# Patient Record
Sex: Male | Born: 1965
Health system: Southern US, Community
[De-identification: ages and names within clinical notes are randomized; demographics above are authoritative.]

## PROBLEM LIST (undated history)

## (undated) DIAGNOSIS — R519 Headache, unspecified: Secondary | ICD-10-CM

## (undated) DIAGNOSIS — N419 Inflammatory disease of prostate, unspecified: Secondary | ICD-10-CM

## (undated) DIAGNOSIS — K219 Gastro-esophageal reflux disease without esophagitis: Secondary | ICD-10-CM

## (undated) DIAGNOSIS — I10 Essential (primary) hypertension: Secondary | ICD-10-CM

## (undated) DIAGNOSIS — Q625 Duplication of ureter: Secondary | ICD-10-CM

## (undated) HISTORY — PX: HAND SURGERY: SHX662

## (undated) HISTORY — PX: BACK SURGERY: SHX140

---

## 2003-03-28 ENCOUNTER — Emergency Department (HOSPITAL_COMMUNITY): Admission: EM | Admit: 2003-03-28 | Discharge: 2003-03-28 | Payer: Self-pay | Admitting: Emergency Medicine

## 2003-09-13 ENCOUNTER — Encounter: Admission: RE | Admit: 2003-09-13 | Discharge: 2003-09-13 | Payer: Self-pay | Admitting: Family Medicine

## 2005-03-20 ENCOUNTER — Encounter: Admission: RE | Admit: 2005-03-20 | Discharge: 2005-03-20 | Payer: Self-pay | Admitting: Family Medicine

## 2005-03-24 ENCOUNTER — Encounter: Admission: RE | Admit: 2005-03-24 | Discharge: 2005-03-24 | Payer: Self-pay | Admitting: Orthopaedic Surgery

## 2005-03-30 ENCOUNTER — Encounter: Admission: RE | Admit: 2005-03-30 | Discharge: 2005-03-30 | Payer: Self-pay | Admitting: Family Medicine

## 2007-01-06 ENCOUNTER — Emergency Department (HOSPITAL_COMMUNITY): Admission: EM | Admit: 2007-01-06 | Discharge: 2007-01-06 | Payer: Self-pay | Admitting: Emergency Medicine

## 2008-07-07 ENCOUNTER — Emergency Department (HOSPITAL_COMMUNITY): Admission: EM | Admit: 2008-07-07 | Discharge: 2008-07-07 | Payer: Self-pay | Admitting: Emergency Medicine

## 2009-06-07 ENCOUNTER — Emergency Department (HOSPITAL_COMMUNITY): Admission: EM | Admit: 2009-06-07 | Discharge: 2009-06-07 | Payer: Self-pay | Admitting: Emergency Medicine

## 2009-08-02 HISTORY — PX: BACK SURGERY: SHX140

## 2011-03-14 ENCOUNTER — Emergency Department (HOSPITAL_COMMUNITY): Payer: BC Managed Care – PPO

## 2011-03-14 ENCOUNTER — Emergency Department (HOSPITAL_COMMUNITY)
Admission: EM | Admit: 2011-03-14 | Discharge: 2011-03-14 | Disposition: A | Discharge status: Home / Self Care | Payer: BC Managed Care – PPO | Attending: Emergency Medicine | Admitting: Emergency Medicine

## 2011-03-14 DIAGNOSIS — M5126 Other intervertebral disc displacement, lumbar region: Secondary | ICD-10-CM | POA: Insufficient documentation

## 2011-03-14 DIAGNOSIS — G8929 Other chronic pain: Secondary | ICD-10-CM | POA: Insufficient documentation

## 2011-03-14 DIAGNOSIS — M79609 Pain in unspecified limb: Secondary | ICD-10-CM | POA: Insufficient documentation

## 2011-03-14 DIAGNOSIS — M545 Low back pain, unspecified: Secondary | ICD-10-CM | POA: Insufficient documentation

## 2011-05-07 LAB — POCT I-STAT, CHEM 8
Calcium, Ion: 1.2 mmol/L (ref 1.12–1.32)
Creatinine, Ser: 1.1 mg/dL (ref 0.4–1.5)
Hemoglobin: 15.6 g/dL (ref 13.0–17.0)
Sodium: 141 mEq/L (ref 135–145)
TCO2: 22 mmol/L (ref 0–100)

## 2011-05-07 LAB — DIFFERENTIAL
Eosinophils Absolute: 0.1 10*3/uL (ref 0.0–0.7)
Eosinophils Relative: 1 % (ref 0–5)
Lymphs Abs: 2.4 10*3/uL (ref 0.7–4.0)
Monocytes Absolute: 1.5 10*3/uL — ABNORMAL HIGH (ref 0.1–1.0)
Neutro Abs: 11.7 10*3/uL — ABNORMAL HIGH (ref 1.7–7.7)
Neutrophils Relative %: 74 % (ref 43–77)

## 2011-05-07 LAB — URINALYSIS, ROUTINE W REFLEX MICROSCOPIC
Bilirubin Urine: NEGATIVE
Protein, ur: >300 mg/dL — AB
Specific Gravity, Urine: 1.015 (ref 1.005–1.030)
Urobilinogen, UA: 0.2 mg/dL (ref 0.0–1.0)

## 2011-05-07 LAB — CBC
MCHC: 33.6 g/dL (ref 30.0–36.0)
Platelets: 229 10*3/uL (ref 150–400)
RBC: 4.99 MIL/uL (ref 4.22–5.81)
RDW: 14.2 % (ref 11.5–15.5)

## 2011-05-07 LAB — URINE MICROSCOPIC-ADD ON

## 2011-05-07 LAB — URINE CULTURE: Colony Count: NO GROWTH

## 2011-05-13 ENCOUNTER — Other Ambulatory Visit (HOSPITAL_COMMUNITY): Payer: BC Managed Care – PPO

## 2011-05-20 ENCOUNTER — Ambulatory Visit (HOSPITAL_COMMUNITY): Payer: BC Managed Care – PPO

## 2011-05-20 ENCOUNTER — Ambulatory Visit (HOSPITAL_COMMUNITY)
Admission: RE | Admit: 2011-05-20 | Discharge: 2011-05-20 | Disposition: A | Discharge status: Home / Self Care | Payer: BC Managed Care – PPO | Source: Ambulatory Visit | Attending: Orthopedic Surgery | Admitting: Orthopedic Surgery

## 2011-05-20 DIAGNOSIS — M5126 Other intervertebral disc displacement, lumbar region: Secondary | ICD-10-CM | POA: Insufficient documentation

## 2011-05-20 DIAGNOSIS — F17200 Nicotine dependence, unspecified, uncomplicated: Secondary | ICD-10-CM | POA: Insufficient documentation

## 2011-05-20 LAB — CBC
MCH: 31.9 pg (ref 26.0–34.0)
MCHC: 35.7 g/dL (ref 30.0–36.0)
MCV: 89.2 fL (ref 78.0–100.0)
Platelets: 242 10*3/uL (ref 150–400)
RDW: 14.4 % (ref 11.5–15.5)

## 2011-05-20 LAB — URINALYSIS, ROUTINE W REFLEX MICROSCOPIC
Glucose, UA: NEGATIVE mg/dL
Leukocytes, UA: NEGATIVE
Protein, ur: NEGATIVE mg/dL
Specific Gravity, Urine: 1.022 (ref 1.005–1.030)
pH: 6 (ref 5.0–8.0)

## 2011-05-20 LAB — DIFFERENTIAL
Eosinophils Absolute: 0.1 10*3/uL (ref 0.0–0.7)
Eosinophils Relative: 1 % (ref 0–5)
Lymphs Abs: 3.8 10*3/uL (ref 0.7–4.0)
Monocytes Absolute: 1.2 10*3/uL — ABNORMAL HIGH (ref 0.1–1.0)
Monocytes Relative: 8 % (ref 3–12)

## 2011-05-20 LAB — COMPREHENSIVE METABOLIC PANEL
ALT: 24 U/L (ref 0–53)
AST: 18 U/L (ref 0–37)
CO2: 23 mEq/L (ref 19–32)
Calcium: 10 mg/dL (ref 8.4–10.5)
GFR calc non Af Amer: >90 mL/min (ref >90)
Potassium: 4 mEq/L (ref 3.5–5.1)
Sodium: 139 mEq/L (ref 135–145)
Total Protein: 7.7 g/dL (ref 6.0–8.3)

## 2011-05-20 LAB — PROTIME-INR: Prothrombin Time: 12.5 seconds (ref 11.6–15.2)

## 2011-05-28 NOTE — Op Note (Signed)
Jesus Wells, Jesus Wells NO.:  [id number]  MEDICAL RECORD NO.:  [id number]  LOCATION:  SDSC                         FACILITY:  [facility]  PHYSICIAN:  Estill Bamberg, MD      DATE OF BIRTH:  [birth year]  DATE OF PROCEDURE:  05/20/2011 DATE OF DISCHARGE:  05/20/2011                              OPERATIVE REPORT   PREOPERATIVE DIAGNOSES: 1. Left-sided S1 radiculopathy. 2. Left-sided L5-S1 disk herniation.  POSTOPERATIVE DIAGNOSES: 1. Left-sided S1 radiculopathy. 2. Left-sided L5-S1 disk herniation.  PROCEDURE:  Left-sided L5-S1 microdiskectomy.  SURGEON:  Estill Bamberg, MD  ASSISTANT:  None.  ANESTHESIA:  General endotracheal anesthesia.  COMPLICATIONS:  None.  DISPOSITION:  Stable.  ESTIMATED BLOOD LOSS:  Minimal.  INDICATIONS FOR PROCEDURE:  Briefly, Jesus Wells is a very pleasant 45 year old male who presented to my office with severe debilitating pain in the left leg.  The patient did have an MRI consistent with disk protrusions at the L4-5 and L5-S1 levels.  I did feel that the L5-S1 level did have a symptomatic correlation associated with it.  I did go forward with a selective nerve root block on the left side targeting the S1 nerve.  He did get a complete temporary improvement in his pain.  Given his failure of nonoperative measures, we did have a discussion regarding going forward with the left-sided L5-S1 microdiskectomy.  The patient fully understood the risks and limitations of the procedure as outlined in my preoperative note.  OPERATIVE DETAILS:  On May 20, 2011, the patient was brought to surgery and general endotracheal anesthesia was administered.  The patient placed prone on a well-padded Jackson flat bed with a Wilson frame.  SCDs were placed.  Antibiotics were given.  I then placed two 18- gauge spinal needles over the L5 and S1 spinous processes in order to help optimize the location of my incision.  I then made a 1 inch incision  directly over the L5-S1 interspace.  The fascia was readily identified and incised with electrocautery in a curvilinear fashion. The paraspinal musculature was gently swept laterally.  A self-retaining McCullough retractor was placed.  I did place a curved curette beneath the L5 lamina.  An additional lateral radiograph was obtained which did confirm the appropriate level.  I then took a 4 mm high-speed bur and removed the inferior medial aspect of the L5 lamina.  I did gently sweep away the ligamentum flavum off the superior aspect of S1.  The ligamentum flavum was taken down and traversing S1 nerve was readily noted.  The partial facetectomy was also performed.  I did gently retract the S1 nerve medially.  An assistant held medial retraction of the S1 nerve and a moderate-sized disk herniation was readily apparent. I did use a Penfield 4 to tease away the superficial layers overlying the disk herniation.  Herniation did go onto readily defer itself and was removed using a pituitary.  Two fragments were removed in their entirety.  At the termination of this portion of the procedure, there was absolutely no undue compression of the traversing S1 nerve.  All epidural bleeding was then controlled using bipolar electrocautery in addition to Gelfoam  and thrombin.  Depo-Medrol 40 mg was infiltrated in the epidural space.  I then closed the fascia using #1 Vicryl.  The subcutaneous layer was closed using 2-0 Vicryl and the skin was closed using 3-0 Monocryl.  Sterile dressing was applied.  All instrument counts were correct at the termination of the procedure.  The patient was then transferred to recovery room in stable condition.     Estill Bamberg, MD     MD/MEDQ  D:  05/20/2011  T:  05/21/2011  Job:  [id number]  cc:   L. Lupe Carney, M.D.  Electronically Signed by Estill Bamberg  on 05/28/2011 05:33:47 PM

## 2013-02-16 ENCOUNTER — Encounter (HOSPITAL_COMMUNITY): Payer: Self-pay | Admitting: *Deleted

## 2013-02-16 ENCOUNTER — Emergency Department (HOSPITAL_COMMUNITY)
Admission: EM | Admit: 2013-02-16 | Discharge: 2013-02-16 | Disposition: A | Discharge status: Home / Self Care | Payer: BC Managed Care – PPO | Source: Home / Self Care | Attending: Emergency Medicine | Admitting: Emergency Medicine

## 2013-02-16 DIAGNOSIS — M5126 Other intervertebral disc displacement, lumbar region: Secondary | ICD-10-CM

## 2013-02-16 LAB — POCT URINALYSIS DIP (DEVICE)
Bilirubin Urine: NEGATIVE
Glucose, UA: NEGATIVE mg/dL
Hgb urine dipstick: NEGATIVE
Ketones, ur: NEGATIVE mg/dL
Nitrite: NEGATIVE
Specific Gravity, Urine: >=1.03 (ref 1.005–1.030)

## 2013-02-16 MED ORDER — OXYCODONE-ACETAMINOPHEN 5-325 MG PO TABS: ORAL_TABLET | ORAL | Status: DC

## 2013-02-16 MED ORDER — HYDROMORPHONE HCL PF 1 MG/ML IJ SOLN
INTRAMUSCULAR | Status: AC
  Filled 2013-02-16: qty 2

## 2013-02-16 MED ORDER — KETOROLAC TROMETHAMINE 60 MG/2ML IM SOLN
INTRAMUSCULAR | Status: AC
  Filled 2013-02-16: qty 2

## 2013-02-16 MED ORDER — KETOROLAC TROMETHAMINE 60 MG/2ML IM SOLN
60.0000 mg | Freq: Once | INTRAMUSCULAR | Status: AC
  Administered 2013-02-16: 60 mg via INTRAMUSCULAR

## 2013-02-16 MED ORDER — ONDANSETRON HCL 4 MG/2ML IJ SOLN: 4.0000 mg | Freq: Once | INTRAMUSCULAR | Status: DC

## 2013-02-16 MED ORDER — METHYLPREDNISOLONE ACETATE 80 MG/ML IJ SUSP
80.0000 mg | Freq: Once | INTRAMUSCULAR | Status: AC
  Administered 2013-02-16: 80 mg via INTRAMUSCULAR

## 2013-02-16 MED ORDER — PREDNISONE 20 MG PO TABS: 20.0000 mg | ORAL_TABLET | Freq: Two times a day (BID) | ORAL | Status: DC

## 2013-02-16 MED ORDER — ONDANSETRON 4 MG PO TBDP
8.0000 mg | ORAL_TABLET | Freq: Once | ORAL | Status: AC
  Administered 2013-02-16: 8 mg via ORAL

## 2013-02-16 MED ORDER — CYCLOBENZAPRINE HCL 5 MG PO TABS: 5.0000 mg | ORAL_TABLET | Freq: Three times a day (TID) | ORAL | Status: DC | PRN

## 2013-02-16 MED ORDER — ONDANSETRON 4 MG PO TBDP
ORAL_TABLET | ORAL | Status: AC
  Filled 2013-02-16: qty 2

## 2013-02-16 MED ORDER — DICLOFENAC SODIUM 75 MG PO TBEC: 75.0000 mg | DELAYED_RELEASE_TABLET | Freq: Two times a day (BID) | ORAL | Status: DC

## 2013-02-16 MED ORDER — METHYLPREDNISOLONE ACETATE 80 MG/ML IJ SUSP
INTRAMUSCULAR | Status: AC
  Filled 2013-02-16: qty 1

## 2013-02-16 MED ORDER — HYDROMORPHONE HCL PF 1 MG/ML IJ SOLN
2.0000 mg | Freq: Once | INTRAMUSCULAR | Status: AC
  Administered 2013-02-16: 2 mg via INTRAMUSCULAR

## 2013-02-16 NOTE — ED Notes (Signed)
Pt  Reports  He  Was  Getting  Off  A  Toilet Seat  And  He  Felt  Sever  Pain in  His  Back  This  Occurred    Yesterday      He  Reports  Pain  Was  On  Movement  And  posistion    He  Reports  Some  Numbness  Down his  l  Leg   Denys  Any  Urinary  Symptoms       Ambulated  With a  Slow  Stooping  gait

## 2013-02-16 NOTE — ED Provider Notes (Signed)
Chief Complaint:   Chief Complaint  Patient presents with  . Back Pain    History of Present Illness:   Jesus Wells is a 47 year old male who has had a one half year history of degenerative disc disease and lower back pain. He was operated on about a year and a half ago by a physician at [facility] orth[facility]. He had done well with occasional flareups of pain ever since then up until this past Wednesday, 3 days ago, when he got up off the commode and experienced sudden onset of pain in the left lower back with radiation into the left buttock and down the left leg as far as the foot with numbness, tingling, and weakness. He's had slight dysuria but denies any other urinary symptoms such as incontinence or urinary retention. He's had no incontinence of bowel. He denies any fever, chills, or unintentional weight loss.  Review of Systems:  Other than noted above, the patient denies any of the following symptoms: Systemic:  No fever, chills, severe fatigue, or unexplained weight loss. GI:  No abdominal pain, nausea, vomiting, diarrhea, constipation, incontinence of bowel, or blood in stool. GU:  No dysuria, frequency, urgency, or hematuria. No incontinence of urine or difficulty urinating.  M-S:  No neck pain, joint pain, arthritis, or myalgias. Neuro:  No paresthesias, saddle anesthesia, muscular weakness, or progressive neurological deficit.  PMFSH:  Past medical history, family history, social history, meds, and allergies were reviewed. Specifically, there is no history of cancer, major trauma, osteoporosis, immunosuppression, or HIV infection.   Physical Exam:   Vital signs:  BP 150/80  Pulse 72  Temp(Src) 98.6 F (37 C) (Oral)  Resp 18  SpO2 100% General:  Alert, oriented, in no distress. Abdomen:  Soft, non-tender.  No organomegaly or mass.  No pulsatile midline abdominal mass or bruit. Back:  He has a lot of tenderness to palpation in his left lower back area which extends from the  lower lumbar spine to the buttock. His back has a very limited range of motion with pain on movement in all directions. Straight leg raising is positive bilaterally, more so on the left than the right. Neuro:  Normal sensations and DTRs. He has decreased dorsiflexion strength on the left as compared to the right. Extremities: Pedal pulses were full, there was no edema. Skin:  Clear, warm and dry.  No rash.  Labs:   Results for orders placed during the hospital encounter of 02/16/13  POCT URINALYSIS DIP (DEVICE)      Result Value Range   Glucose, UA NEGATIVE  NEGATIVE mg/dL   Bilirubin Urine NEGATIVE  NEGATIVE   Ketones, ur NEGATIVE  NEGATIVE mg/dL   Specific Gravity, Urine >=1.030  1.005 - 1.030   Hgb urine dipstick NEGATIVE  NEGATIVE   pH 6.0  5.0 - 8.0   Protein, ur NEGATIVE  NEGATIVE mg/dL   Urobilinogen, UA 0.2  0.0 - 1.0 mg/dL   Nitrite NEGATIVE  NEGATIVE   Leukocytes, UA NEGATIVE  NEGATIVE    Course in Urgent Care Center:   He was given Zofran ODT 8 mg sublingually followed by Dilaudid 2 mg IM, Toradol 60 mg IM, and Depo-Medrol 80 mg IM.  Assessment:  The encounter diagnosis was HNP (herniated nucleus pulposus), lumbar.  Probable L4-L5 disc.  Plan:   1.  The following meds were prescribed:   Discharge Medication List as of 02/16/2013  4:21 PM    START taking these medications   Details  cyclobenzaprine (FLEXERIL)  5 MG tablet Take 1 tablet (5 mg total) by mouth 3 (three) times daily as needed for muscle spasms., Starting 02/16/2013, Until Discontinued, Normal    diclofenac (VOLTAREN) 75 MG EC tablet Take 1 tablet (75 mg total) by mouth 2 (two) times daily., Starting 02/16/2013, Until Discontinued, Normal    oxyCODONE-acetaminophen (PERCOCET) 5-325 MG per tablet 1 to 2 tablets every 6 hours as needed for pain., Print    predniSONE (DELTASONE) 20 MG tablet Take 1 tablet (20 mg total) by mouth 2 (two) times daily., Starting 02/16/2013, Until Discontinued, Normal       2.  The  patient was instructed in symptomatic care and handouts were given. 3.  The patient was told to return if becoming worse in any way, if no better in 2 weeks, and given some red flag symptoms including worsening pain or changing neurological symptoms that would indicate earlier return. 4.  The patient was encouraged to try to be as active as possible and given some exercises to do followed by moist heat. 5.  Follow up with his orthopedist as soon as possible.    Reuben Likes, MD 02/16/13 [phone]

## 2014-03-01 ENCOUNTER — Emergency Department (HOSPITAL_COMMUNITY)
Admission: EM | Admit: 2014-03-01 | Discharge: 2014-03-01 | Disposition: A | Discharge status: Home / Self Care | Payer: BC Managed Care – PPO | Source: Home / Self Care

## 2014-03-01 ENCOUNTER — Encounter (HOSPITAL_COMMUNITY): Payer: Self-pay | Admitting: Emergency Medicine

## 2014-03-01 DIAGNOSIS — M5442 Lumbago with sciatica, left side: Principal | ICD-10-CM

## 2014-03-01 DIAGNOSIS — M5441 Lumbago with sciatica, right side: Secondary | ICD-10-CM

## 2014-03-01 DIAGNOSIS — M5430 Sciatica, unspecified side: Secondary | ICD-10-CM

## 2014-03-01 MED ORDER — HYDROMORPHONE HCL PF 1 MG/ML IJ SOLN
INTRAMUSCULAR | Status: AC
  Filled 2014-03-01: qty 1

## 2014-03-01 MED ORDER — CYCLOBENZAPRINE HCL 10 MG PO TABS: 10.0000 mg | ORAL_TABLET | Freq: Every evening | ORAL | Status: DC | PRN

## 2014-03-01 MED ORDER — PREDNISONE 20 MG PO TABS
40.0000 mg | ORAL_TABLET | Freq: Once | ORAL | Status: AC
  Administered 2014-03-01: 40 mg via ORAL

## 2014-03-01 MED ORDER — PREDNISONE 10 MG PO KIT
PACK | ORAL | Status: DC
Start: 2014-03-01 — End: 2018-01-04

## 2014-03-01 MED ORDER — HYDROMORPHONE HCL PF 1 MG/ML IJ SOLN
1.0000 mg | Freq: Once | INTRAMUSCULAR | Status: AC
  Administered 2014-03-01: 1 mg via INTRAMUSCULAR

## 2014-03-01 MED ORDER — HYDROCODONE-ACETAMINOPHEN 5-325 MG PO TABS: 1.0000 | ORAL_TABLET | Freq: Four times a day (QID) | ORAL | Status: DC | PRN

## 2014-03-01 MED ORDER — PREDNISONE 10 MG PO KIT: PACK | ORAL | Status: DC

## 2014-03-01 MED ORDER — PREDNISONE 20 MG PO TABS
ORAL_TABLET | ORAL | Status: AC
  Filled 2014-03-01: qty 2

## 2014-03-01 NOTE — ED Provider Notes (Signed)
Jesus Wells is a 48 y.o. male who presents to Urgent Care today for back pain. Patient developed acute onset of severe back pain today. Pain started this morning when he bent over to pickup object up. The pain is located in the bilateral lower back and radiates to bilateral legs. He denies any weakness or numbness bowel bladder dysfunction or difficulty walking. He's tried ibuprofen which has not helped. His pain is consistent with previous episodes of back pain flare ups.   History reviewed. No pertinent past medical history. History  Substance Use Topics  . Smoking status: Current Every Day Smoker -- 1.00 packs/day    Types: Cigarettes  . Smokeless tobacco: Not on file  . Alcohol Use: No   ROS as above Medications: Current Facility-Administered Medications  Medication Dose Route Frequency Provider Last Rate Last Dose  . HYDROmorphone (DILAUDID) injection 1 mg  1 mg Intramuscular Once Gregor Hams, MD      . predniSONE (DELTASONE) tablet 40 mg  40 mg Oral Once Gregor Hams, MD       Current Outpatient Prescriptions  Medication Sig Dispense Refill  . cyclobenzaprine (FLEXERIL) 10 MG tablet Take 1 tablet (10 mg total) by mouth at bedtime as needed for muscle spasms.  20 tablet  0  . HYDROcodone-acetaminophen (NORCO/VICODIN) 5-325 MG per tablet Take 1 tablet by mouth every 6 (six) hours as needed.  25 tablet  0  . PredniSONE 10 MG KIT 12 day dose pack po  1 kit  0    Exam:  BP 150/107  Pulse 100  Temp(Src) 98.4 F (36.9 C) (Oral)  Resp 18  SpO2 97% Gen: Well NAD in pain appearing HEENT: EOMI,  MMM Lungs: Normal work of breathing. CTABL Heart: RRR no MRG Abd: NABS, Soft. Nondistended, Nontender Exts: Brisk capillary refill, warm and well perfused.  Back: Nontender to spinal midline. Tender palpation bilateral lumbar paraspinals. Neck range of motion significantly limited by pain Reflexes are diminished but equal bilateral knees.  Lower extremity strength is intact. Patient can  stand squat stand on toes and heels. He walks with an antalgic gait.  Sensation is intact throughout  No results found for this or any previous visit (from the past 24 hour(s)). No results found.  Assessment and Plan: 48 y.o. male with lumbago with sciatica symptoms. Neurologically intact. Plan to treat with Dilaudid injection in the office as well as oral prednisone. Prescription for prednisone Dosepak Norco and Flexeril. Follow up with primary care provider or spinal surgeon.  Discussed warning signs or symptoms. Please see discharge instructions. Patient expresses understanding.   This note was created using Systems analyst. Any transcription errors are unintended.    Gregor Hams, MD 03/01/14 2017

## 2014-03-01 NOTE — ED Notes (Signed)
Pt c/o back pain onset this am  Reports he bent over to reach for a can of soda when he felt pain Pain increases w/activity and radiates down to bilateral legs Brought back in wheelchair Alert w/no signs of acute distress.

## 2014-03-01 NOTE — Discharge Instructions (Signed)
Thank you for coming in today. Take prednisone starting tomorrow Use Norco for severe pain Use Flexeril as needed for muscle spasms Come back as needed If worse go to the emergency room Followup with your primary care provider or with your spine surgeon Come back or go to the emergency room if you notice new weakness new numbness problems walking or bowel or bladder problems.  Sciatica Sciatica is pain, weakness, numbness, or tingling along the path of the sciatic nerve. The nerve starts in the lower back and runs down the back of each leg. The nerve controls the muscles in the lower leg and in the back of the knee, while also providing sensation to the back of the thigh, lower leg, and the sole of your foot. Sciatica is a symptom of another medical condition. For instance, nerve damage or certain conditions, such as a herniated disk or bone spur on the spine, pinch or put pressure on the sciatic nerve. This causes the pain, weakness, or other sensations normally associated with sciatica. Generally, sciatica only affects one side of the body. CAUSES   Herniated or slipped disc.  Degenerative disk disease.  A pain disorder involving the narrow muscle in the buttocks (piriformis syndrome).  Pelvic injury or fracture.  Pregnancy.  Tumor (rare). SYMPTOMS  Symptoms can vary from mild to very severe. The symptoms usually travel from the low back to the buttocks and down the back of the leg. Symptoms can include:  Mild tingling or dull aches in the lower back, leg, or hip.  Numbness in the back of the calf or sole of the foot.  Burning sensations in the lower back, leg, or hip.  Sharp pains in the lower back, leg, or hip.  Leg weakness.  Severe back pain inhibiting movement. These symptoms may get worse with coughing, sneezing, laughing, or prolonged sitting or standing. Also, being overweight may worsen symptoms. DIAGNOSIS  Your caregiver will perform a physical exam to look for  common symptoms of sciatica. He or she may ask you to do certain movements or activities that would trigger sciatic nerve pain. Other tests may be performed to find the cause of the sciatica. These may include:  Blood tests.  X-rays.  Imaging tests, such as an MRI or CT scan. TREATMENT  Treatment is directed at the cause of the sciatic pain. Sometimes, treatment is not necessary and the pain and discomfort goes away on its own. If treatment is needed, your caregiver may suggest:  Over-the-counter medicines to relieve pain.  Prescription medicines, such as anti-inflammatory medicine, muscle relaxants, or narcotics.  Applying heat or ice to the painful area.  Steroid injections to lessen pain, irritation, and inflammation around the nerve.  Reducing activity during periods of pain.  Exercising and stretching to strengthen your abdomen and improve flexibility of your spine. Your caregiver may suggest losing weight if the extra weight makes the back pain worse.  Physical therapy.  Surgery to eliminate what is pressing or pinching the nerve, such as a bone spur or part of a herniated disk. HOME CARE INSTRUCTIONS   Only take over-the-counter or prescription medicines for pain or discomfort as directed by your caregiver.  Apply ice to the affected area for 20 minutes, 3-4 times a day for the first 48-72 hours. Then try heat in the same way.  Exercise, stretch, or perform your usual activities if these do not aggravate your pain.  Attend physical therapy sessions as directed by your caregiver.  Keep all follow-up  appointments as directed by your caregiver.  Do not wear high heels or shoes that do not provide proper support.  Check your mattress to see if it is too soft. A firm mattress may lessen your pain and discomfort. SEEK IMMEDIATE MEDICAL CARE IF:   You lose control of your bowel or bladder (incontinence).  You have increasing weakness in the lower back, pelvis, buttocks, or  legs.  You have redness or swelling of your back.  You have a burning sensation when you urinate.  You have pain that gets worse when you lie down or awakens you at night.  Your pain is worse than you have experienced in the past.  Your pain is lasting longer than 4 weeks.  You are suddenly losing weight without reason. MAKE SURE YOU:  Understand these instructions.  Will watch your condition.  Will get help right away if you are not doing well or get worse. Document Released: 07/13/2001 Document Revised: 01/18/2012 Document Reviewed: 11/28/2011 [facility] Patient Information 2015 [place], [place]. This information is not intended to replace advice given to you by your health care provider. Make sure you discuss any questions you have with your health care provider.

## 2016-09-01 DIAGNOSIS — G4700 Insomnia, unspecified: Secondary | ICD-10-CM | POA: Diagnosis not present

## 2016-09-01 DIAGNOSIS — R51 Headache: Secondary | ICD-10-CM | POA: Diagnosis not present

## 2016-09-01 DIAGNOSIS — R030 Elevated blood-pressure reading, without diagnosis of hypertension: Secondary | ICD-10-CM | POA: Diagnosis not present

## 2016-09-01 DIAGNOSIS — R451 Restlessness and agitation: Secondary | ICD-10-CM | POA: Diagnosis not present

## 2016-10-01 ENCOUNTER — Other Ambulatory Visit: Payer: Self-pay | Admitting: Family Medicine

## 2016-10-01 ENCOUNTER — Ambulatory Visit
Admission: RE | Admit: 2016-10-01 | Discharge: 2016-10-01 | Disposition: A | Discharge status: Home / Self Care | Payer: 59 | Source: Ambulatory Visit | Attending: Family Medicine | Admitting: Family Medicine

## 2016-10-01 DIAGNOSIS — R05 Cough: Secondary | ICD-10-CM

## 2016-10-01 DIAGNOSIS — H0520 Unspecified exophthalmos: Secondary | ICD-10-CM | POA: Diagnosis not present

## 2016-10-01 DIAGNOSIS — R059 Cough, unspecified: Secondary | ICD-10-CM

## 2016-10-01 DIAGNOSIS — R51 Headache: Secondary | ICD-10-CM | POA: Diagnosis not present

## 2016-10-01 DIAGNOSIS — R519 Headache, unspecified: Secondary | ICD-10-CM

## 2016-10-01 DIAGNOSIS — Z77018 Contact with and (suspected) exposure to other hazardous metals: Secondary | ICD-10-CM

## 2016-10-01 DIAGNOSIS — R030 Elevated blood-pressure reading, without diagnosis of hypertension: Secondary | ICD-10-CM | POA: Diagnosis not present

## 2016-10-08 ENCOUNTER — Other Ambulatory Visit: Payer: Self-pay

## 2016-11-09 ENCOUNTER — Encounter (HOSPITAL_COMMUNITY): Payer: Self-pay

## 2016-11-09 ENCOUNTER — Emergency Department (HOSPITAL_COMMUNITY): Payer: 59

## 2016-11-09 ENCOUNTER — Emergency Department (HOSPITAL_COMMUNITY)
Admission: EM | Admit: 2016-11-09 | Discharge: 2016-11-09 | Disposition: A | Discharge status: Home / Self Care | Payer: 59 | Attending: Emergency Medicine | Admitting: Emergency Medicine

## 2016-11-09 DIAGNOSIS — K529 Noninfective gastroenteritis and colitis, unspecified: Secondary | ICD-10-CM

## 2016-11-09 DIAGNOSIS — R197 Diarrhea, unspecified: Secondary | ICD-10-CM | POA: Diagnosis not present

## 2016-11-09 DIAGNOSIS — F17210 Nicotine dependence, cigarettes, uncomplicated: Secondary | ICD-10-CM | POA: Insufficient documentation

## 2016-11-09 DIAGNOSIS — R000 Tachycardia, unspecified: Secondary | ICD-10-CM | POA: Diagnosis not present

## 2016-11-09 DIAGNOSIS — R109 Unspecified abdominal pain: Secondary | ICD-10-CM | POA: Diagnosis not present

## 2016-11-09 LAB — COMPREHENSIVE METABOLIC PANEL
ALT: 58 U/L (ref 17–63)
AST: 51 U/L — AB (ref 15–41)
Albumin: 4.1 g/dL (ref 3.5–5.0)
Alkaline Phosphatase: 80 U/L (ref 38–126)
Anion gap: 12 (ref 5–15)
BILIRUBIN TOTAL: 0.3 mg/dL (ref 0.3–1.2)
BUN: 16 mg/dL (ref 6–20)
CALCIUM: 9.2 mg/dL (ref 8.9–10.3)
CO2: 23 mmol/L (ref 22–32)
CREATININE: 1.41 mg/dL — AB (ref 0.61–1.24)
Chloride: 102 mmol/L (ref 101–111)
GFR, EST NON AFRICAN AMERICAN: 57 mL/min — AB (ref >60)
Glucose, Bld: 109 mg/dL — ABNORMAL HIGH (ref 65–99)
Potassium: 4.3 mmol/L (ref 3.5–5.1)
Sodium: 137 mmol/L (ref 135–145)
TOTAL PROTEIN: 7.6 g/dL (ref 6.5–8.1)

## 2016-11-09 LAB — URINALYSIS, ROUTINE W REFLEX MICROSCOPIC
BILIRUBIN URINE: NEGATIVE
Bacteria, UA: NONE SEEN
Glucose, UA: NEGATIVE mg/dL
Ketones, ur: NEGATIVE mg/dL
LEUKOCYTES UA: NEGATIVE
NITRITE: NEGATIVE
Protein, ur: NEGATIVE mg/dL
SPECIFIC GRAVITY, URINE: 1.035 — AB (ref 1.005–1.030)
SQUAMOUS EPITHELIAL / LPF: NONE SEEN
pH: 5 (ref 5.0–8.0)

## 2016-11-09 LAB — CBC
HCT: 49.5 % (ref 39.0–52.0)
Hemoglobin: 17 g/dL (ref 13.0–17.0)
MCH: 30.9 pg (ref 26.0–34.0)
MCHC: 34.3 g/dL (ref 30.0–36.0)
MCV: 89.8 fL (ref 78.0–100.0)
Platelets: 193 10*3/uL (ref 150–400)
RBC: 5.51 MIL/uL (ref 4.22–5.81)
RDW: 14.4 % (ref 11.5–15.5)
WBC: 10.3 10*3/uL (ref 4.0–10.5)

## 2016-11-09 LAB — LIPASE, BLOOD: Lipase: 12 U/L (ref 11–51)

## 2016-11-09 MED ORDER — SODIUM CHLORIDE 0.9 % IV BOLUS (SEPSIS)
1000.0000 mL | Freq: Once | INTRAVENOUS | Status: AC
  Administered 2016-11-09: 1000 mL via INTRAVENOUS

## 2016-11-09 MED ORDER — ONDANSETRON 4 MG PO TBDP: 4.0000 mg | ORAL_TABLET | Freq: Three times a day (TID) | ORAL | 0 refills | Status: DC | PRN

## 2016-11-09 MED ORDER — ONDANSETRON HCL 4 MG/2ML IJ SOLN
4.0000 mg | Freq: Once | INTRAMUSCULAR | Status: AC
  Administered 2016-11-09: 4 mg via INTRAVENOUS
  Filled 2016-11-09: qty 2

## 2016-11-09 MED ORDER — IOPAMIDOL (ISOVUE-300) INJECTION 61%
INTRAVENOUS | Status: AC
  Administered 2016-11-09: 100 mL
  Filled 2016-11-09: qty 100

## 2016-11-09 MED ORDER — MORPHINE SULFATE (PF) 4 MG/ML IV SOLN
4.0000 mg | Freq: Once | INTRAVENOUS | Status: AC
Start: 2016-11-09 — End: 2016-11-09
  Administered 2016-11-09: 4 mg via INTRAVENOUS
  Filled 2016-11-09: qty 1

## 2016-11-09 MED ORDER — MORPHINE SULFATE (PF) 4 MG/ML IV SOLN
4.0000 mg | Freq: Once | INTRAVENOUS | Status: AC
  Administered 2016-11-09: 4 mg via INTRAVENOUS
  Filled 2016-11-09: qty 1

## 2016-11-09 NOTE — Discharge Instructions (Signed)
As discussed, you have been diagnosed with enteritis, or inflammation of the bowels. It is important that you monitor your condition carefully, drink plenty of fluids, get plenty of rest, and do not hesitate to return for concerning changes in your condition.

## 2016-11-09 NOTE — ED Provider Notes (Signed)
[facility] DEPT Provider Note   CSN: [id number] Arrival date & time: 11/09/16  1341     History   Chief Complaint Chief Complaint  Patient presents with  . Diarrhea    HPI Jesus Wells is a 51 y.o. male.  HPI Patient presents with concern of diarrhea, weakness, vomiting. Illness began 2 days ago, without any clear precipitant. Patient works as a Government social research officer, has no recent travel, diet changes, medication changes, sick contacts. Since onset he has had innumerable episodes of diarrhea, increasing generalized weakness, and today has had several episodes of vomiting. He describes subjective fever and chills. There is mild associated abdominal pain, described as crampy, primarily in the left lower quadrant, but diffuse across the lower abdomen.   Patient has no history of abdominal surgery, states that he is generally well aside from smoking cigarettes. Since onset alleviating or exacerbating factors, specifically no relief with his attempt at using milk for symptom control.  History reviewed. No pertinent past medical history.  There are no active problems to display for this patient.   Past Surgical History:  Procedure Laterality Date  . BACK SURGERY         Home Medications    Prior to Admission medications   Medication Sig Start Date End Date Taking? Authorizing Provider  cyclobenzaprine (FLEXERIL) 10 MG tablet Take 1 tablet (10 mg total) by mouth at bedtime as needed for muscle spasms. 03/01/14   Gregor Hams, MD  HYDROcodone-acetaminophen (NORCO/VICODIN) 5-325 MG per tablet Take 1 tablet by mouth every 6 (six) hours as needed. 03/01/14   Gregor Hams, MD  PredniSONE 10 MG KIT 12 day dose pack po 03/01/14   Gregor Hams, MD    Family History No family history on file.  Social History Social History  Substance Use Topics  . Smoking status: Current Every Day Smoker    Packs/day: 1.00    Types: Cigarettes  . Smokeless tobacco: Former Systems developer  . Alcohol use No       Allergies   Patient has no known allergies.   Review of Systems Review of Systems  Constitutional:       Per HPI, otherwise negative  HENT:       Per HPI, otherwise negative  Respiratory:       Per HPI, otherwise negative  Cardiovascular:       Per HPI, otherwise negative  Gastrointestinal: Positive for abdominal pain, diarrhea, nausea and vomiting.  Endocrine:       Negative aside from HPI  Genitourinary:       Neg aside from HPI   Musculoskeletal:       Per HPI, otherwise negative  Skin: Negative.   Allergic/Immunologic: Negative for immunocompromised state.  Neurological: Positive for weakness. Negative for syncope.     Physical Exam Updated Vital Signs BP (!) 137/105   Pulse (!) 135   Temp 99.3 F (37.4 C) (Oral)   Resp 20   Ht _0  (1.88 m)   Wt 240 lb (108.9 kg)   SpO2 97%   BMI 30.81 kg/m   Physical Exam  Constitutional: He is oriented to person, place, and time. He appears well-developed. No distress.  HENT:  Head: Normocephalic and atraumatic.  Eyes: Conjunctivae and EOM are normal.  Cardiovascular: Regular rhythm.  Tachycardia present.   Pulmonary/Chest: Effort normal. No stridor. No respiratory distress.  Abdominal: He exhibits no distension.    Musculoskeletal: He exhibits no edema.  Neurological: He is alert and oriented  to person, place, and time.  Skin: Skin is warm and dry.  Psychiatric: He has a normal mood and affect.  Nursing note and vitals reviewed.    ED Treatments / Results  Labs (all labs ordered are listed, but only abnormal results are displayed) Labs Reviewed  COMPREHENSIVE METABOLIC PANEL - Abnormal; Notable for the following:       Result Value   Glucose, Bld 109 (*)    Creatinine, Ser 1.41 (*)    AST 51 (*)    GFR calc non Af Amer 57 (*)    All other components within normal limits  LIPASE, BLOOD  CBC  URINALYSIS, ROUTINE W REFLEX MICROSCOPIC    EKG  EKG Interpretation  Date/Time:  Tuesday November 09 2016 13:53:06 EDT Ventricular Rate:  146 PR Interval:  138 QRS Duration: 72 QT Interval:  260 QTC Calculation: 405 R Axis:   54 Text Interpretation:  Sinus tachycardia Otherwise normal ECG Confirmed by Carmin Muskrat  MD [phone]) on 11/09/2016 3:30:56 PM       Radiology Ct Abdomen Pelvis W Contrast  Result Date: 11/09/2016 CLINICAL DATA:  Abdominal pain, diarrhea starting Monday morning, tachycardia EXAM: CT ABDOMEN AND PELVIS WITH CONTRAST TECHNIQUE: Multidetector CT imaging of the abdomen and pelvis was performed using the standard protocol following bolus administration of intravenous contrast. CONTRAST:  1 ISOVUE-300 IOPAMIDOL (ISOVUE-300) INJECTION 61% COMPARISON:  07/07/2008 FINDINGS: Lower chest: Lung bases shows no acute findings. Hepatobiliary: Mild fatty infiltration of the liver. No calcified gallstones are noted within gallbladder. No focal hepatic mass. Pancreas: Unremarkable. No pancreatic ductal dilatation or surrounding inflammatory changes. Spleen: Normal in size without focal abnormality. Adrenals/Urinary Tract: No adrenal gland mass. Again noted duplicated left ureter. Again noted upper moiety of the left kidney is significant obstructed with cortical thinning and chronic dilatation of the upper collecting system and upper moiety ureter down to the level of the urinary bladder. Again noted ureterocele within bladder measures about 2.3 cm without significant change from prior exam. There is cortical thinning upper pole of the left kidney. The lower pole moiety of the left kidney has normal appearance without dilatation of corresponding ureter. No nephrolithiasis. Bilateral no calcified ureteral calculi are noted. Delay renal images shows normal excretion of the right ureter. There is normal excretion of the lower moiety left kidney. Stomach/Bowel: No gastric outlet obstruction. There are fluid and distended small bowel loops in mid abdomen and right lower abdomen without transition  point in caliber of small bowel. Findings are highly suspicious for distal enteritis. Some liquid stool and gas noted throughout the colon suspicious for diarrhea. Some gas and residual stool noted within rectum. No distal colonic obstruction. No definite evidence of acute colitis. No pericecal inflammation. Normal retrocecal appendix noted in axial image 63. Vascular/Lymphatic: No aortic aneurysm. No retroperitoneal or mesenteric adenopathy. Reproductive: Prostate gland measures 4.9 by 3.7 cm. Stable punctate calcifications within prostate gland centrally. Other: Small bilateral inguinal scrotal canal hernia containing fat without evidence of acute complication. Musculoskeletal: No destructive bony lesions are noted. Sagittal images of the spine shows mild degenerative changes lower thoracic and lower lumbar spine. IMPRESSION: 1. There is mild fatty infiltration of the liver. 2. Again noted duplicated left ureter. There is chronic hydronephrosis and hydroureter in upper pole of the left kidney. Again noted filling defect within bladder at the insertion of dilated left ureter probable ureterocele. There is progression in cortical thinning upper pole of the left kidney probable due to chronic obstructive uropathy. The lower  pole of the left kidney demonstrates normal excretion without distension of corresponding ureter. 3. There are mild distended with fluid small bowel loops in mid abdomen and right lower abdomen without transition point in caliber. Findings highly suspicious for distal enteritis. Some liquid stool and gas noted throughout the colon suspicious for diarrhea. No definite evidence of thickening of colonic wall. No pericolonic abscess or inflammation. 4. Normal appendix.  No pericecal inflammation. 5. Bilateral inguinal scrotal canal small hernia containing fat without evidence of acute complication. Electronically Signed   By: Lahoma Crocker M.D.   On: 11/09/2016 16:36    Procedures Procedures  (including critical care time)  Medications Ordered in ED Medications  sodium chloride 0.9 % bolus 1,000 mL (0 mLs Intravenous Stopped 11/09/16 1646)  morphine 4 MG/ML injection 4 mg (4 mg Intravenous Given 11/09/16 1530)  ondansetron (ZOFRAN) injection 4 mg (4 mg Intravenous Given 11/09/16 1530)  iopamidol (ISOVUE-300) 61 % injection (100 mLs  Contrast Given 11/09/16 1607)  sodium chloride 0.9 % bolus 1,000 mL (0 mLs Intravenous Stopped 11/09/16 1800)  sodium chloride 0.9 % bolus 1,000 mL (0 mLs Intravenous Stopped 11/09/16 1927)  morphine 4 MG/ML injection 4 mg (4 mg Intravenous Given 11/09/16 1800)     Initial Impression / Assessment and Plan / ED Course  I have reviewed the triage vital signs and the nursing notes.  Pertinent labs & imaging results that were available during my care of the patient were reviewed by me and considered in my medical decision making (see chart for details).  7:47 PM Now fallen 3 L fluid resuscitation, the patient has been drinking fluids, eating crackers no nausea, no vomiting, states that he feels better. Heart rate has been variable, but diminished since arrival, with rates in the 90/115 range. We had a lengthy conversation about all findings, with him in multiple family members present with a substantial improvement, he elected for outpatient follow-up, and after a long conversation about home care, diet, return precautions, the patient was discharged in stable condition.  Final Clinical Impressions(s) / ED Diagnoses   Final diagnoses:  Enteritis    New Prescriptions New Prescriptions   ONDANSETRON (ZOFRAN ODT) 4 MG DISINTEGRATING TABLET    Take 1 tablet (4 mg total) by mouth every 8 (eight) hours as needed for nausea or vomiting.     Carmin Muskrat, MD 11/09/16 [phone]

## 2016-11-09 NOTE — ED Triage Notes (Signed)
Pt presents to the ed with complaints of of abdominal pain and diarrhea that started Monday morning.  [facility] 150 so they sent him here. Patient is ambulatory in triage. In no apparent distress

## 2016-11-10 ENCOUNTER — Emergency Department (HOSPITAL_COMMUNITY): Payer: 59

## 2016-11-10 ENCOUNTER — Emergency Department (HOSPITAL_COMMUNITY)
Admission: EM | Admit: 2016-11-10 | Discharge: 2016-11-10 | Disposition: A | Discharge status: Home / Self Care | Payer: 59 | Attending: Emergency Medicine | Admitting: Emergency Medicine

## 2016-11-10 DIAGNOSIS — R197 Diarrhea, unspecified: Secondary | ICD-10-CM | POA: Diagnosis not present

## 2016-11-10 DIAGNOSIS — R109 Unspecified abdominal pain: Secondary | ICD-10-CM | POA: Diagnosis not present

## 2016-11-10 DIAGNOSIS — R112 Nausea with vomiting, unspecified: Secondary | ICD-10-CM | POA: Diagnosis not present

## 2016-11-10 DIAGNOSIS — F17210 Nicotine dependence, cigarettes, uncomplicated: Secondary | ICD-10-CM | POA: Insufficient documentation

## 2016-11-10 LAB — COMPREHENSIVE METABOLIC PANEL
ALBUMIN: 3.5 g/dL (ref 3.5–5.0)
ALT: 58 U/L (ref 17–63)
ANION GAP: 13 (ref 5–15)
AST: 48 U/L — ABNORMAL HIGH (ref 15–41)
Alkaline Phosphatase: 68 U/L (ref 38–126)
BUN: 15 mg/dL (ref 6–20)
CO2: 22 mmol/L (ref 22–32)
Calcium: 8.8 mg/dL — ABNORMAL LOW (ref 8.9–10.3)
Chloride: 103 mmol/L (ref 101–111)
Creatinine, Ser: 1.17 mg/dL (ref 0.61–1.24)
GFR calc non Af Amer: >60 mL/min (ref >60)
GLUCOSE: 101 mg/dL — AB (ref 65–99)
POTASSIUM: 4.2 mmol/L (ref 3.5–5.1)
Sodium: 138 mmol/L (ref 135–145)
Total Bilirubin: 0.7 mg/dL (ref 0.3–1.2)
Total Protein: 6.8 g/dL (ref 6.5–8.1)

## 2016-11-10 LAB — CBC
HEMATOCRIT: 45 % (ref 39.0–52.0)
HEMOGLOBIN: 15.6 g/dL (ref 13.0–17.0)
MCH: 31 pg (ref 26.0–34.0)
MCHC: 34.7 g/dL (ref 30.0–36.0)
MCV: 89.3 fL (ref 78.0–100.0)
Platelets: 155 10*3/uL (ref 150–400)
RBC: 5.04 MIL/uL (ref 4.22–5.81)
RDW: 14.5 % (ref 11.5–15.5)
WBC: 5.8 10*3/uL (ref 4.0–10.5)

## 2016-11-10 LAB — LIPASE, BLOOD: LIPASE: 12 U/L (ref 11–51)

## 2016-11-10 MED ORDER — HYDROMORPHONE HCL 1 MG/ML IJ SOLN
1.0000 mg | Freq: Once | INTRAMUSCULAR | Status: AC
  Administered 2016-11-10: 1 mg via INTRAVENOUS
  Filled 2016-11-10: qty 1

## 2016-11-10 MED ORDER — HYDROCODONE-ACETAMINOPHEN 5-325 MG PO TABS: 1.0000 | ORAL_TABLET | Freq: Four times a day (QID) | ORAL | 0 refills | Status: DC | PRN

## 2016-11-10 MED ORDER — ONDANSETRON HCL 4 MG/2ML IJ SOLN
4.0000 mg | Freq: Once | INTRAMUSCULAR | Status: AC
  Administered 2016-11-10: 4 mg via INTRAVENOUS
  Filled 2016-11-10: qty 2

## 2016-11-10 MED ORDER — SODIUM CHLORIDE 0.9 % IV BOLUS (SEPSIS)
1000.0000 mL | Freq: Once | INTRAVENOUS | Status: AC
  Administered 2016-11-10: 1000 mL via INTRAVENOUS

## 2016-11-10 MED ORDER — HYDROCODONE-ACETAMINOPHEN 5-325 MG PO TABS
2.0000 | ORAL_TABLET | Freq: Once | ORAL | Status: AC
  Administered 2016-11-10: 2 via ORAL
  Filled 2016-11-10: qty 2

## 2016-11-10 NOTE — ED Notes (Signed)
Patient transported to X-ray 

## 2016-11-10 NOTE — ED Provider Notes (Signed)
[facility] DEPT Provider Note   CSN: [id number] Arrival date & time: 11/10/16  1239     History   Chief Complaint Chief Complaint  Patient presents with  . Diarrhea  . Emesis    HPI Jesus Wells is a 51 y.o. male.  Patient presents to the emergency department with chief complaint of nausea, vomiting, diarrhea. He states that he was seen here yesterday for the same. He had a CT scan and was diagnosed with enteritis. He denies any associated fever, but states that as soon as pain medicine wore off last night, his symptoms returned. He denies any bloody vomit or play diarrhea. Denies any recent foreign travel. Denies any history of abdominal surgeries. There are no worsening symptoms. There are no modifying factors. There are no other associated symptoms.   The history is provided by the patient. No language interpreter was used.    No past medical history on file.  There are no active problems to display for this patient.   Past Surgical History:  Procedure Laterality Date  . BACK SURGERY         Home Medications    Prior to Admission medications   Medication Sig Start Date End Date Taking? Authorizing Provider  cyclobenzaprine (FLEXERIL) 10 MG tablet Take 1 tablet (10 mg total) by mouth at bedtime as needed for muscle spasms. 03/01/14   Gregor Hams, MD  HYDROcodone-acetaminophen (NORCO/VICODIN) 5-325 MG per tablet Take 1 tablet by mouth every 6 (six) hours as needed. Patient not taking: Reported on 11/09/2016 03/01/14   Gregor Hams, MD  ibuprofen (ADVIL,MOTRIN) 200 MG tablet Take 600 mg by mouth every 6 (six) hours as needed (for headaches).    Historical Provider, MD  loperamide (IMODIUM A-D) 2 MG tablet Take 2 mg by mouth 4 (four) times daily as needed for diarrhea or loose stools.    Historical Provider, MD  ondansetron (ZOFRAN ODT) 4 MG disintegrating tablet Take 1 tablet (4 mg total) by mouth every 8 (eight) hours as needed for nausea or vomiting. 11/09/16    Carmin Muskrat, MD  PredniSONE 10 MG KIT 12 day dose pack po Patient not taking: Reported on 11/09/2016 03/01/14   Gregor Hams, MD    Family History No family history on file.  Social History Social History  Substance Use Topics  . Smoking status: Current Every Day Smoker    Packs/day: 1.00    Types: Cigarettes  . Smokeless tobacco: Former Systems developer  . Alcohol use No     Allergies   Onion   Review of Systems Review of Systems  All other systems reviewed and are negative.    Physical Exam Updated Vital Signs BP 116/89 (BP Location: Left Arm)   Pulse (!) 125   Temp 99.3 F (37.4 C) (Oral)   Resp 16   SpO2 95%   Physical Exam  Constitutional: He is oriented to person, place, and time. He appears well-developed and well-nourished.  HENT:  Head: Normocephalic and atraumatic.  Eyes: Conjunctivae and EOM are normal. Pupils are equal, round, and reactive to light. Right eye exhibits no discharge. Left eye exhibits no discharge. No scleral icterus.  Neck: Normal range of motion. Neck supple. No JVD present.  Cardiovascular: Regular rhythm and normal heart sounds.  Exam reveals no gallop and no friction rub.   No murmur heard. tachycardic  Pulmonary/Chest: Effort normal and breath sounds normal. No respiratory distress. He has no wheezes. He has no rales. He exhibits no tenderness.  Abdominal: Soft. He exhibits no distension and no mass. There is tenderness. There is no rebound and no guarding.  Lower abdominal tenderness  Musculoskeletal: Normal range of motion. He exhibits no edema or tenderness.  Neurological: He is alert and oriented to person, place, and time.  Skin: Skin is warm and dry.  Psychiatric: He has a normal mood and affect. His behavior is normal. Judgment and thought content normal.  Nursing note and vitals reviewed.    ED Treatments / Results  Labs (all labs ordered are listed, but only abnormal results are displayed) Labs Reviewed  COMPREHENSIVE  METABOLIC PANEL - Abnormal; Notable for the following:       Result Value   Glucose, Bld 101 (*)    Calcium 8.8 (*)    AST 48 (*)    All other components within normal limits  LIPASE, BLOOD  CBC  I-STAT CG4 LACTIC ACID, ED    EKG  EKG Interpretation None       Radiology Ct Abdomen Pelvis W Contrast  Result Date: 11/09/2016 CLINICAL DATA:  Abdominal pain, diarrhea starting Monday morning, tachycardia EXAM: CT ABDOMEN AND PELVIS WITH CONTRAST TECHNIQUE: Multidetector CT imaging of the abdomen and pelvis was performed using the standard protocol following bolus administration of intravenous contrast. CONTRAST:  1 ISOVUE-300 IOPAMIDOL (ISOVUE-300) INJECTION 61% COMPARISON:  07/07/2008 FINDINGS: Lower chest: Lung bases shows no acute findings. Hepatobiliary: Mild fatty infiltration of the liver. No calcified gallstones are noted within gallbladder. No focal hepatic mass. Pancreas: Unremarkable. No pancreatic ductal dilatation or surrounding inflammatory changes. Spleen: Normal in size without focal abnormality. Adrenals/Urinary Tract: No adrenal gland mass. Again noted duplicated left ureter. Again noted upper moiety of the left kidney is significant obstructed with cortical thinning and chronic dilatation of the upper collecting system and upper moiety ureter down to the level of the urinary bladder. Again noted ureterocele within bladder measures about 2.3 cm without significant change from prior exam. There is cortical thinning upper pole of the left kidney. The lower pole moiety of the left kidney has normal appearance without dilatation of corresponding ureter. No nephrolithiasis. Bilateral no calcified ureteral calculi are noted. Delay renal images shows normal excretion of the right ureter. There is normal excretion of the lower moiety left kidney. Stomach/Bowel: No gastric outlet obstruction. There are fluid and distended small bowel loops in mid abdomen and right lower abdomen without  transition point in caliber of small bowel. Findings are highly suspicious for distal enteritis. Some liquid stool and gas noted throughout the colon suspicious for diarrhea. Some gas and residual stool noted within rectum. No distal colonic obstruction. No definite evidence of acute colitis. No pericecal inflammation. Normal retrocecal appendix noted in axial image 63. Vascular/Lymphatic: No aortic aneurysm. No retroperitoneal or mesenteric adenopathy. Reproductive: Prostate gland measures 4.9 by 3.7 cm. Stable punctate calcifications within prostate gland centrally. Other: Small bilateral inguinal scrotal canal hernia containing fat without evidence of acute complication. Musculoskeletal: No destructive bony lesions are noted. Sagittal images of the spine shows mild degenerative changes lower thoracic and lower lumbar spine. IMPRESSION: 1. There is mild fatty infiltration of the liver. 2. Again noted duplicated left ureter. There is chronic hydronephrosis and hydroureter in upper pole of the left kidney. Again noted filling defect within bladder at the insertion of dilated left ureter probable ureterocele. There is progression in cortical thinning upper pole of the left kidney probable due to chronic obstructive uropathy. The lower pole of the left kidney demonstrates normal excretion without distension  of corresponding ureter. 3. There are mild distended with fluid small bowel loops in mid abdomen and right lower abdomen without transition point in caliber. Findings highly suspicious for distal enteritis. Some liquid stool and gas noted throughout the colon suspicious for diarrhea. No definite evidence of thickening of colonic wall. No pericolonic abscess or inflammation. 4. Normal appendix.  No pericecal inflammation. 5. Bilateral inguinal scrotal canal small hernia containing fat without evidence of acute complication. Electronically Signed   By: Lahoma Crocker M.D.   On: 11/09/2016 16:36   Dg Abd Acute  W/chest  Result Date: 11/10/2016 CLINICAL DATA:  Abdominal pain and nausea EXAM: DG ABDOMEN ACUTE W/ 1V CHEST COMPARISON:  Chest radiographs October 01, 2016; CT abdomen and pelvis November 09, 2016 FINDINGS: PA chest: There is scarring in the left base. There is also mild bibasilar atelectasis in the bases, not present on most recent study. Lungs elsewhere are clear. Heart size and pulmonary vascularity are normal. No adenopathy. Supine and upright abdomen: There is moderate stool in the colon. There is no bowel dilatation or air-fluid level suggesting bowel obstruction. No free air. There are small phleboliths in the pelvis. IMPRESSION: No bowel obstruction or free air evident. Mild bibasilar atelectasis superimposed on left base scarring. No lung edema or consolidation. Electronically Signed   By: Lowella Grip III M.D.   On: 11/10/2016 14:29    Procedures Procedures (including critical care time)  Medications Ordered in ED Medications  sodium chloride 0.9 % bolus 1,000 mL (1,000 mLs Intravenous New Bag/Given 11/10/16 1445)  HYDROmorphone (DILAUDID) injection 1 mg (1 mg Intravenous Given 11/10/16 1444)  ondansetron (ZOFRAN) injection 4 mg (4 mg Intravenous Given 11/10/16 1444)     Initial Impression / Assessment and Plan / ED Course  I have reviewed the triage vital signs and the nursing notes.  Pertinent labs & imaging results that were available during my care of the patient were reviewed by me and considered in my medical decision making (see chart for details).     Patient with persistent nausea, vomiting, diarrhea. He was seen yesterday and diagnosed with enteritis. Had a CT scan which showed no evidence of obstruction, normal appendix, but did show distal enteritis. Patient was sent home with nausea medicine after fluid repletion. Patient states that is his pain medicine wore off, his symptoms return. He like to get better pain control today, and would still like to be able to go home. Will  recheck labs, and we'll also check plain films of the abdomen to rule out free air/air-fluid levels.  Laboratory workup is reassuring.  Lactate not crossing in Epic, but is 1.12.  Patient is getting fluids. Reassess.  3:29 PM Feeling improved, will fluid challenge.  HR on repeat exam is now 105.  Anticipate discharge if tolerates PO.  3:53 PM Tolerating fluids, pain improved, patient is ambulated. He feels improved. Discharge to home.  Final Clinical Impressions(s) / ED Diagnoses   Final diagnoses:  Nausea vomiting and diarrhea    New Prescriptions New Prescriptions   HYDROCODONE-ACETAMINOPHEN (NORCO/VICODIN) 5-325 MG TABLET    Take 1-2 tablets by mouth every 6 (six) hours as needed.     Montine Circle, PA-C 11/10/16 [place], MD 11/12/16 [phone]

## 2016-11-10 NOTE — ED Triage Notes (Signed)
Pt returns to ED with diarrhea, nausea/vomiting/headache since Sunday. Seen here yesterday for same. Pt reports feeling worse today. Anything he eats runs right through him. Appears weak. Rating pain 10/10 allover.

## 2016-11-11 LAB — I-STAT CG4 LACTIC ACID, ED: LACTIC ACID, VENOUS: 1.12 mmol/L (ref 0.5–1.9)

## 2017-04-15 DIAGNOSIS — G4700 Insomnia, unspecified: Secondary | ICD-10-CM | POA: Diagnosis not present

## 2017-04-15 DIAGNOSIS — R030 Elevated blood-pressure reading, without diagnosis of hypertension: Secondary | ICD-10-CM | POA: Diagnosis not present

## 2017-04-15 DIAGNOSIS — R51 Headache: Secondary | ICD-10-CM | POA: Diagnosis not present

## 2017-11-22 DIAGNOSIS — R51 Headache: Secondary | ICD-10-CM | POA: Diagnosis not present

## 2017-11-22 DIAGNOSIS — I10 Essential (primary) hypertension: Secondary | ICD-10-CM | POA: Diagnosis not present

## 2017-11-22 DIAGNOSIS — G4700 Insomnia, unspecified: Secondary | ICD-10-CM | POA: Diagnosis not present

## 2017-12-28 ENCOUNTER — Other Ambulatory Visit (HOSPITAL_COMMUNITY): Payer: Self-pay | Admitting: Physician Assistant

## 2017-12-28 DIAGNOSIS — R000 Tachycardia, unspecified: Secondary | ICD-10-CM | POA: Diagnosis not present

## 2017-12-28 DIAGNOSIS — R079 Chest pain, unspecified: Secondary | ICD-10-CM

## 2017-12-28 DIAGNOSIS — I10 Essential (primary) hypertension: Secondary | ICD-10-CM | POA: Diagnosis not present

## 2018-01-03 ENCOUNTER — Telehealth (HOSPITAL_COMMUNITY): Payer: Self-pay | Admitting: Physician Assistant

## 2018-01-03 ENCOUNTER — Other Ambulatory Visit: Payer: Self-pay | Admitting: Physician Assistant

## 2018-01-03 DIAGNOSIS — R079 Chest pain, unspecified: Secondary | ICD-10-CM

## 2018-01-03 NOTE — Telephone Encounter (Signed)
User: Jesus Wells, Jesus Wells A Date/time: 01/03/18 10:23 AM  Comment: Called pt and lmsg for her to CB due to the wrong test being scheduled for the patient.   Context:  Outcome: Left Message  Phone number: [phone] Phone Type: Home Phone  Comm. type: Telephone Call type: Outgoing  Contact: Bath,Marla Relation to patient: Emergency Contact

## 2018-01-04 ENCOUNTER — Encounter (HOSPITAL_COMMUNITY): Payer: Self-pay | Admitting: Emergency Medicine

## 2018-01-04 ENCOUNTER — Ambulatory Visit (HOSPITAL_COMMUNITY)
Admission: EM | Admit: 2018-01-04 | Discharge: 2018-01-04 | Disposition: A | Discharge status: Home / Self Care | Payer: 59 | Attending: Internal Medicine | Admitting: Internal Medicine

## 2018-01-04 DIAGNOSIS — M545 Low back pain, unspecified: Secondary | ICD-10-CM

## 2018-01-04 DIAGNOSIS — M62830 Muscle spasm of back: Secondary | ICD-10-CM

## 2018-01-04 DIAGNOSIS — M62838 Other muscle spasm: Secondary | ICD-10-CM

## 2018-01-04 DIAGNOSIS — G8929 Other chronic pain: Secondary | ICD-10-CM

## 2018-01-04 MED ORDER — KETOROLAC TROMETHAMINE 60 MG/2ML IM SOLN
60.0000 mg | Freq: Once | INTRAMUSCULAR | Status: AC
Start: 2018-01-04 — End: 2018-01-04
  Administered 2018-01-04: 60 mg via INTRAMUSCULAR

## 2018-01-04 MED ORDER — HYDROCODONE-ACETAMINOPHEN 5-325 MG PO TABS: 1.0000 | ORAL_TABLET | Freq: Four times a day (QID) | ORAL | 0 refills | Status: AC | PRN

## 2018-01-04 MED ORDER — TIZANIDINE HCL 4 MG PO CAPS: 4.0000 mg | ORAL_CAPSULE | Freq: Three times a day (TID) | ORAL | 0 refills | Status: DC | PRN

## 2018-01-04 MED ORDER — KETOROLAC TROMETHAMINE 60 MG/2ML IM SOLN
INTRAMUSCULAR | Status: AC
  Filled 2018-01-04: qty 2

## 2018-01-04 MED ORDER — PREDNISONE 10 MG PO TABS: 20.0000 mg | ORAL_TABLET | Freq: Two times a day (BID) | ORAL | 0 refills | Status: AC

## 2018-01-04 NOTE — ED Provider Notes (Signed)
[facility]   [id number] 01/04/18 Arrival Time: 1951  SUBJECTIVE: History from: patient. Jesus Wells is a 52 y.o. male hx significant for chronic back pain complains of right lower back pain that began 1 day ago.  Began after stepping off a curb and twisted his back.  Localizes the pain to the right low back.  Describes the pain as constant and throbbing in character.  Has tried OTC medications and cyclobenzaprine without relief.  Symptoms are made worse with movement.  Reports similar symptoms in the past.  Denies fever, chills, nausea, vomiting, erythema, ecchymosis, effusion, weakness, numbness and tingling.     ROS: As per HPI.  History reviewed. No pertinent past medical history. Past Surgical History:  Procedure Laterality Date  . BACK SURGERY     Allergies  Allergen Reactions  . Onion Anaphylaxis and Swelling    Tongue swells and closes off his airway   No current facility-administered medications on file prior to encounter.    Current Outpatient Medications on File Prior to Encounter  Medication Sig Dispense Refill  . amitriptyline (ELAVIL) 100 MG tablet Take 100 mg by mouth at bedtime.    Marland Kitchen atorvastatin (LIPITOR) 10 MG tablet Take 10 mg by mouth daily.    . cyclobenzaprine (FLEXERIL) 10 MG tablet Take 1 tablet (10 mg total) by mouth at bedtime as needed for muscle spasms. 20 tablet 0  . ibuprofen (ADVIL,MOTRIN) 200 MG tablet Take 600 mg by mouth every 6 (six) hours as needed (for headaches).    . propranolol (INDERAL) 60 MG tablet Take 60 mg by mouth 2 (two) times daily.    Marland Kitchen loperamide (IMODIUM A-D) 2 MG tablet Take 2 mg by mouth 4 (four) times daily as needed for diarrhea or loose stools.    . ondansetron (ZOFRAN ODT) 4 MG disintegrating tablet Take 1 tablet (4 mg total) by mouth every 8 (eight) hours as needed for nausea or vomiting. 20 tablet 0   Social History   Socioeconomic History  . Marital status: Married    Spouse name: Not on file  . Number of  children: Not on file  . Years of education: Not on file  . Highest education level: Not on file  Occupational History  . Not on file  Social Needs  . Financial resource strain: Not on file  . Food insecurity:    Worry: Not on file    Inability: Not on file  . Transportation needs:    Medical: Not on file    Non-medical: Not on file  Tobacco Use  . Smoking status: Current Every Day Smoker    Packs/day: 1.00    Types: Cigarettes  . Smokeless tobacco: Former Engineer, water and Sexual Activity  . Alcohol use: No  . Drug use: No  . Sexual activity: Not on file  Lifestyle  . Physical activity:    Days per week: Not on file    Minutes per session: Not on file  . Stress: Not on file  Relationships  . Social connections:    Talks on phone: Not on file    Gets together: Not on file    Attends religious service: Not on file    Active member of club or organization: Not on file    Attends meetings of clubs or organizations: Not on file    Relationship status: Not on file  . Intimate partner violence:    Fear of current or ex partner: Not on file  Emotionally abused: Not on file    Physically abused: Not on file    Forced sexual activity: Not on file  Other Topics Concern  . Not on file  Social History Narrative  . Not on file   No family history on file.  OBJECTIVE:  Vitals:   01/04/18 2003 01/04/18 2004  BP: (!) 141/83   Pulse: (!) 104   Resp: 16   Temp: 98.4 F (36.9 C)   TempSrc: Oral   SpO2: 97%   Weight:  235 lb (106.6 kg)    General appearance: AOx3; in no acute distress. Appears uncomfortable Head: NCAT Lungs: CTA bilaterally Heart: RRR.  Clear S1 and S2 without murmur, gallops, or rubs.  Radial pulses 2+ bilaterally. Musculoskeletal: Back  Inspection: Skin warm, dry, clear and intact without obvious erythema, effusion, or ecchymosis.  Palpation: Tender to palpation over the right lower paravertebral muscles ROM: LROM Strength: 5/5 shld abduction,  5/5 shld adduction, 5/5 elbow flexion, 5/5  elbow extension,  5/5 hip flexion, 5/5 knee abduction, 5/5 knee adduction,  5/5 knee flexion, 5/5 knee extension Skin: warm and dry Neurologic: Ambulates without difficulty; Sensation intact about the upper/ lower extremities Psychological: alert and cooperative; normal mood and affect  ASSESSMENT & PLAN:  1. Acute right-sided low back pain without sciatica   2. Muscle spasm     @NIMG @  Meds ordered this encounter  Medications  . ketorolac (TORADOL) injection 60 mg  . HYDROcodone-acetaminophen (NORCO/VICODIN) 5-325 MG tablet    Sig: Take 1-2 tablets by mouth every 6 (six) hours as needed for up to 5 days for severe pain.    Dispense:  15 tablet    Refill:  0    Order Specific Question:   Supervising Provider    Answer:   Isa RankinMURRAY, LAURA WILSON [phone][[id number]]  . tiZANidine (ZANAFLEX) 4 MG capsule    Sig: Take 1 capsule (4 mg total) by mouth 3 (three) times daily as needed for muscle spasms.    Dispense:  15 capsule    Refill:  0    Order Specific Question:   Supervising Provider    Answer:   Isa RankinMURRAY, LAURA WILSON [phone][[id number]]  . predniSONE (DELTASONE) 10 MG tablet    Sig: Take 2 tablets (20 mg total) by mouth 2 (two) times daily with a meal for 5 days.    Dispense:  20 tablet    Refill:  0    Order Specific Question:   Supervising Provider    Answer:   Isa RankinMURRAY, LAURA WILSON [[id number]]   Toradol shot given Continue conservative management of rest, ice, heat, and gentle stretches Take tizanidine at nighttime for symptomatic relief. Avoid driving or operating heavy machinery while using medication. Do NOT take with other muscle relaxors Prednisone taper prescribed.  Take as directed and to completion Norco prescribed.  Take as needed for break-through pain.   Follow up with PCP if symptoms persist Return or go to the ER if you have any worsening or new symptoms (fever, chills, chest pain, abdominal pain, changes in bowel or bladder habits, pain  radiating into lower legs, etc...)   [place] Controlled Substances Registry consulted for this patient. I feel the risk/benefit ratio today is favorable for proceeding with this prescription for a controlled substance. Medication sedation precautions given.  Reviewed expectations re: course of current medical issues. Questions answered. Outlined signs and symptoms indicating need for more acute intervention. Patient verbalized understanding. After Visit Summary given.    Rennis HardingWurst, Terilynn Buresh, PA-C 01/04/18 2129

## 2018-01-04 NOTE — Discharge Instructions (Addendum)
Toradol shot given Continue conservative management of rest, ice, heat, and gentle stretches Take tizanidine at nighttime for symptomatic relief. Avoid driving or operating heavy machinery while using medication. Do NOT take with other muscle relaxors Prednisone taper prescribed.  Take as directed and to completion Norco prescribed.  Take as needed for break-through pain.   Follow up with PCP if symptoms persist Present to ER if worsening or new symptoms (fever, chills, chest pain, abdominal pain, changes in bowel or bladder habits, pain radiating into lower legs, etc...)

## 2018-01-04 NOTE — ED Triage Notes (Signed)
PT has chronic back problems with surgery in the past. PT tweaked back yesterday. Right lower back pain

## 2018-01-09 ENCOUNTER — Other Ambulatory Visit (HOSPITAL_COMMUNITY): Payer: 59

## 2018-02-24 ENCOUNTER — Ambulatory Visit (INDEPENDENT_AMBULATORY_CARE_PROVIDER_SITE_OTHER): Payer: 59

## 2018-02-24 DIAGNOSIS — R079 Chest pain, unspecified: Secondary | ICD-10-CM

## 2018-02-24 LAB — EXERCISE TOLERANCE TEST
CHL CUP MPHR: 170 {beats}/min
CHL CUP RESTING HR STRESS: 104 {beats}/min
CHL RATE OF PERCEIVED EXERTION: 19
CSEPEDS: 25 s
Estimated workload: 6.3 METS
Exercise duration (min): 4 min
Peak HR: 155 {beats}/min
Percent HR: 91 %

## 2018-04-24 DIAGNOSIS — S61411A Laceration without foreign body of right hand, initial encounter: Secondary | ICD-10-CM | POA: Diagnosis not present

## 2018-04-25 DIAGNOSIS — S61419D Laceration without foreign body of unspecified hand, subsequent encounter: Secondary | ICD-10-CM | POA: Diagnosis not present

## 2018-04-27 DIAGNOSIS — S61419D Laceration without foreign body of unspecified hand, subsequent encounter: Secondary | ICD-10-CM | POA: Diagnosis not present

## 2018-05-08 DIAGNOSIS — S61411D Laceration without foreign body of right hand, subsequent encounter: Secondary | ICD-10-CM | POA: Diagnosis not present

## 2018-05-26 DIAGNOSIS — R51 Headache: Secondary | ICD-10-CM | POA: Diagnosis not present

## 2018-05-26 DIAGNOSIS — I10 Essential (primary) hypertension: Secondary | ICD-10-CM | POA: Diagnosis not present

## 2018-05-26 DIAGNOSIS — Z125 Encounter for screening for malignant neoplasm of prostate: Secondary | ICD-10-CM | POA: Diagnosis not present

## 2018-05-26 DIAGNOSIS — E782 Mixed hyperlipidemia: Secondary | ICD-10-CM | POA: Diagnosis not present

## 2018-05-26 DIAGNOSIS — G4700 Insomnia, unspecified: Secondary | ICD-10-CM | POA: Diagnosis not present

## 2019-09-24 ENCOUNTER — Ambulatory Visit (HOSPITAL_COMMUNITY)
Admission: EM | Admit: 2019-09-24 | Discharge: 2019-09-24 | Disposition: A | Discharge status: Home / Self Care | Payer: 59 | Attending: Family Medicine | Admitting: Family Medicine

## 2019-09-24 ENCOUNTER — Encounter (HOSPITAL_COMMUNITY): Payer: Self-pay

## 2019-09-24 ENCOUNTER — Other Ambulatory Visit: Payer: Self-pay

## 2019-09-24 DIAGNOSIS — N10 Acute tubulo-interstitial nephritis: Secondary | ICD-10-CM | POA: Diagnosis present

## 2019-09-24 LAB — POCT URINALYSIS DIP (DEVICE)
Glucose, UA: 100 mg/dL — AB
Ketones, ur: 15 mg/dL — AB
Nitrite: POSITIVE — AB
Protein, ur: >=300 mg/dL — AB
Specific Gravity, Urine: 1.025 (ref 1.005–1.030)
Urobilinogen, UA: 1 mg/dL (ref 0.0–1.0)
pH: 5.5 (ref 5.0–8.0)

## 2019-09-24 MED ORDER — HYDROCODONE-ACETAMINOPHEN 5-325 MG PO TABS
ORAL_TABLET | ORAL | Status: AC
  Filled 2019-09-24: qty 1

## 2019-09-24 MED ORDER — CEFTRIAXONE SODIUM 1 G IJ SOLR
INTRAMUSCULAR | Status: AC
  Filled 2019-09-24: qty 10

## 2019-09-24 MED ORDER — ONDANSETRON 4 MG PO TBDP
ORAL_TABLET | ORAL | Status: AC
  Filled 2019-09-24: qty 1

## 2019-09-24 MED ORDER — HYDROCODONE-ACETAMINOPHEN 5-325 MG PO TABS: 1.0000 | ORAL_TABLET | ORAL | 0 refills | Status: AC | PRN

## 2019-09-24 MED ORDER — ONDANSETRON HCL 4 MG PO TABS: 4.0000 mg | ORAL_TABLET | Freq: Four times a day (QID) | ORAL | 0 refills | Status: DC

## 2019-09-24 MED ORDER — ONDANSETRON 4 MG PO TBDP
4.0000 mg | ORAL_TABLET | Freq: Once | ORAL | Status: AC
  Administered 2019-09-24: 17:00:00 4 mg via ORAL

## 2019-09-24 MED ORDER — HYDROCODONE-ACETAMINOPHEN 5-325 MG PO TABS
1.0000 | ORAL_TABLET | Freq: Once | ORAL | Status: AC
  Administered 2019-09-24: 17:00:00 1 via ORAL

## 2019-09-24 MED ORDER — CIPROFLOXACIN HCL 500 MG PO TABS: 500.0000 mg | ORAL_TABLET | Freq: Two times a day (BID) | ORAL | 0 refills | Status: AC

## 2019-09-24 MED ORDER — CEFTRIAXONE SODIUM 1 G IJ SOLR
1.0000 g | Freq: Once | INTRAMUSCULAR | Status: AC
  Administered 2019-09-24: 17:00:00 1 g via INTRAMUSCULAR

## 2019-09-24 MED ORDER — LIDOCAINE HCL (PF) 1 % IJ SOLN
INTRAMUSCULAR | Status: AC
  Filled 2019-09-24: qty 2

## 2019-09-24 NOTE — ED Triage Notes (Signed)
Patient presents to Urgent Care with complaints of blood in urine and frequent urination since about 24 hours ago. Patient reports he thinks he has a bladder infection or kidney stone, no hx of kidney stones.

## 2019-09-24 NOTE — Discharge Instructions (Addendum)
I believe that you have a kidney infection.  I would like for you to begin the antibiotics as prescribed.  Take the ciprofloxacin 2 times a day for 7 days.  Take 1-2 Vicodin for moderate to severe pain up to every 6 hours.  For mild pain utilize regular strength Tylenol.  I would like for you to take Zofran along with the Vicodin to prevent nausea and vomiting.  If you are not experiencing improvement in your symptoms within 24 hours, have a significant reduction in your urine production or stop making urine, have increasing fever not responsive to Tylenol,  I would like for you to go to the emergency department.

## 2019-09-24 NOTE — ED Provider Notes (Signed)
[facility]    CSN: [id number] Arrival date & time: 09/24/19  1512      History   Chief Complaint Chief Complaint  Patient presents with  . Hematuria    HPI Jesus Wells is a 54 y.o. male.   Patient presents to urgent care for 1 day of acute onset of painful urination, blood in his urine, fever and back pain.  Patient reports severe lower abdominal pain starting yesterday with painful urination and blood in his urine.  Pain has been severe up to 9-10 out of 10 primarily in his lower abdomen.  He is also felt like he is having fever with body ache and chills.  He has taken ibuprofen for this.  He has had some back pain as well.  He has been using the bathroom about every 15 minutes for the last 1 day.  He has had associated lack of appetite and no desire to eat.  He does not have a history of urinary tract infections or kidney stones.     History reviewed. No pertinent past medical history.  There are no problems to display for this patient.   Past Surgical History:  Procedure Laterality Date  . BACK SURGERY         Home Medications    Prior to Admission medications   Medication Sig Start Date End Date Taking? Authorizing Provider  amitriptyline (ELAVIL) 100 MG tablet Take 100 mg by mouth at bedtime.    [provider]  atorvastatin (LIPITOR) 10 MG tablet Take 10 mg by mouth daily.    [provider]  ciprofloxacin (CIPRO) 500 MG tablet Take 1 tablet (500 mg total) by mouth 2 (two) times daily for 7 days. 09/24/19 10/01/19  Kirsten Spearing, Veryl Speak, PA-C  cyclobenzaprine (FLEXERIL) 10 MG tablet Take 1 tablet (10 mg total) by mouth at bedtime as needed for muscle spasms. 03/01/14   Rodolph Bong, MD  HYDROcodone-acetaminophen (NORCO/VICODIN) 5-325 MG tablet Take 1-2 tablets by mouth every 4 (four) hours as needed for up to 5 days. 09/24/19 09/29/19  Caydyn Sprung, Veryl Speak, PA-C  ibuprofen (ADVIL,MOTRIN) 200 MG tablet Take 600 mg by mouth every 6 (six) hours as needed  (for headaches).    [provider]  loperamide (IMODIUM A-D) 2 MG tablet Take 2 mg by mouth 4 (four) times daily as needed for diarrhea or loose stools.    [provider]  ondansetron (ZOFRAN ODT) 4 MG disintegrating tablet Take 1 tablet (4 mg total) by mouth every 8 (eight) hours as needed for nausea or vomiting. 11/09/16   Gerhard Munch, MD  ondansetron (ZOFRAN) 4 MG tablet Take 1 tablet (4 mg total) by mouth every 6 (six) hours. 09/24/19   Tzippy Testerman, Veryl Speak, PA-C  propranolol (INDERAL) 60 MG tablet Take 60 mg by mouth 2 (two) times daily.    [provider]    Family History Family History  Problem Relation Age of Onset  . Healthy Mother   . Healthy Father     Social History Social History   Tobacco Use  . Smoking status: Current Every Day Smoker    Packs/day: 1.50    Types: Cigarettes  . Smokeless tobacco: Former Engineer, water Use Topics  . Alcohol use: No  . Drug use: No     Allergies   Onion   Review of Systems Review of Systems  Constitutional: Positive for appetite change, chills and fever.  HENT: Negative.   Eyes: Negative for pain  and visual disturbance.  Respiratory: Negative for cough and shortness of breath.   Cardiovascular: Negative for chest pain and palpitations.  Gastrointestinal: Positive for abdominal pain and nausea. Negative for constipation, diarrhea and vomiting.  Genitourinary: Positive for dysuria, frequency and hematuria. Negative for discharge, penile pain, penile swelling and scrotal swelling.  Musculoskeletal: Positive for arthralgias, back pain and myalgias.  Skin: Negative for color change and rash.  Neurological: Negative for seizures, syncope and headaches.  All other systems reviewed and are negative.    Physical Exam Triage Vital Signs ED Triage Vitals  Enc Vitals Group     BP 09/24/19 1548 (!) 154/106     Pulse Rate 09/24/19 1548 (!) 111     Resp 09/24/19 1548 (!) 24     Temp 09/24/19 1548 99.9 F  (37.7 C)     Temp Source 09/24/19 1548 Oral     SpO2 09/24/19 1548 100 %     Weight --      Height --      Head Circumference --      Peak Flow --      Pain Score 09/24/19 1547 10     Pain Loc --      Pain Edu? --      Excl. in GC? --    No data found.  Updated Vital Signs BP (!) 154/106 (BP Location: Right Arm)   Pulse (!) 111   Temp 99.9 F (37.7 C) (Oral)   Resp (!) 24   SpO2 100%   Visual Acuity Right Eye Distance:   Left Eye Distance:   Bilateral Distance:    Right Eye Near:   Left Eye Near:    Bilateral Near:     Physical Exam Vitals and nursing note reviewed.  Constitutional:      General: He is in acute distress.     Appearance: He is well-developed. He is ill-appearing.  HENT:     Head: Normocephalic and atraumatic.  Eyes:     General: No scleral icterus.    Extraocular Movements: Extraocular movements intact.     Conjunctiva/sclera: Conjunctivae normal.     Pupils: Pupils are equal, round, and reactive to light.  Cardiovascular:     Rate and Rhythm: Regular rhythm. Tachycardia present.     Heart sounds: No murmur.  Pulmonary:     Effort: Pulmonary effort is normal. No respiratory distress.     Breath sounds: Normal breath sounds. No wheezing, rhonchi or rales.  Abdominal:     Palpations: Abdomen is soft.     Tenderness: There is abdominal tenderness (Suprapubic). There is left CVA tenderness. There is no right CVA tenderness.  Musculoskeletal:        General: Normal range of motion.     Cervical back: Normal range of motion and neck supple.     Right lower leg: No edema.     Left lower leg: No edema.  Skin:    General: Skin is warm and dry.  Neurological:     General: No focal deficit present.     Mental Status: He is alert and oriented to person, place, and time.  Psychiatric:        Mood and Affect: Mood normal.        Behavior: Behavior normal.        Thought Content: Thought content normal.        Judgment: Judgment normal.      UC  Treatments / Results  Labs (all labs ordered  are listed, but only abnormal results are displayed) Labs Reviewed  POCT URINALYSIS DIP (DEVICE) - Abnormal; Notable for the following components:      Result Value   Glucose, UA 100 (*)    Bilirubin Urine SMALL (*)    Ketones, ur 15 (*)    Hgb urine dipstick LARGE (*)    Protein, ur >=300 (*)    Nitrite POSITIVE (*)    Leukocytes,Ua LARGE (*)    All other components within normal limits  URINE CULTURE    EKG   Radiology No results found.  Procedures Procedures (including critical care time)  Medications Ordered in UC Medications  HYDROcodone-acetaminophen (NORCO/VICODIN) 5-325 MG per tablet 1 tablet (has no administration in time range)  ondansetron (ZOFRAN-ODT) disintegrating tablet 4 mg (has no administration in time range)  cefTRIAXone (ROCEPHIN) injection 1 g (has no administration in time range)    Initial Impression / Assessment and Plan / UC Course  I have reviewed the triage vital signs and the nursing notes.  Pertinent labs & imaging results that were available during my care of the patient were reviewed by me and considered in my medical decision making (see chart for details).    #Acute pyelonephritis Patient is a 54 year old male presenting with symptoms consistent with acute pyelonephritis.  UA with large leukocytes and nitrite positive and hemoglobin.  Urine with red coloration.  He has a low-grade fever in clinic and is tachycardic to the 110s but is hemodynamically stable and he is making urine.  We are treating his pain and initiating antibiotic therapy in clinic today.  Will send out with Vicodin for pain and ciprofloxacin for antimicrobial therapy.  Urine culture is sent.   -Strict emergency department precautions were discussed with the patient, such that should he experience worsening pain, continued decreased urine output, high fever or no improvement in 24 hours he should report to the emergency department.    -1 g Rocephin and 5 of Norco given in clinic -Ciprofloxacin twice daily x7 days -Tylenol for fever when not taking Vicodin  Final Clinical Impressions(s) / UC Diagnoses   Final diagnoses:  Acute pyelonephritis     Discharge Instructions     I believe that you have a kidney infection.  I would like for you to begin the antibiotics as prescribed.  Take the ciprofloxacin 2 times a day for 7 days.  Take 1-2 Vicodin for moderate to severe pain up to every 6 hours.  For mild pain utilize regular strength Tylenol.  I would like for you to take Zofran along with the Vicodin to prevent nausea and vomiting.  If you are not experiencing improvement in your symptoms within 24 hours, have a significant reduction in your urine production or stop making urine, have increasing fever not responsive to Tylenol,  I would like for you to go to the emergency department.        ED Prescriptions    Medication Sig Dispense Auth. Provider   ciprofloxacin (CIPRO) 500 MG tablet Take 1 tablet (500 mg total) by mouth 2 (two) times daily for 7 days. 14 tablet Abdullahi Vallone, Marguerita Beards, PA-C   HYDROcodone-acetaminophen (NORCO/VICODIN) 5-325 MG tablet Take 1-2 tablets by mouth every 4 (four) hours as needed for up to 5 days. 10 tablet Gabriellah Rabel, Marguerita Beards, PA-C   ondansetron (ZOFRAN) 4 MG tablet Take 1 tablet (4 mg total) by mouth every 6 (six) hours. 12 tablet Gervis Gaba, Marguerita Beards, PA-C     I have reviewed the PDMP  during this encounter.   Hermelinda Medicus, PA-C 09/24/19 [phone]

## 2019-09-26 LAB — URINE CULTURE: Culture: >=100000 — AB

## 2019-10-01 DIAGNOSIS — N12 Tubulo-interstitial nephritis, not specified as acute or chronic: Secondary | ICD-10-CM

## 2019-10-01 HISTORY — DX: Tubulo-interstitial nephritis, not specified as acute or chronic: N12

## 2019-10-09 ENCOUNTER — Emergency Department (HOSPITAL_COMMUNITY): Payer: 59

## 2019-10-09 ENCOUNTER — Encounter (HOSPITAL_COMMUNITY): Payer: Self-pay | Admitting: Emergency Medicine

## 2019-10-09 ENCOUNTER — Other Ambulatory Visit: Payer: Self-pay

## 2019-10-09 ENCOUNTER — Observation Stay (HOSPITAL_COMMUNITY)
Admission: EM | Admit: 2019-10-09 | Discharge: 2019-10-11 | DRG: 728 | Disposition: A | Discharge status: Home / Self Care | Payer: 59 | Attending: Internal Medicine | Admitting: Internal Medicine

## 2019-10-09 DIAGNOSIS — N12 Tubulo-interstitial nephritis, not specified as acute or chronic: Secondary | ICD-10-CM | POA: Diagnosis present

## 2019-10-09 DIAGNOSIS — Z66 Do not resuscitate: Secondary | ICD-10-CM | POA: Diagnosis not present

## 2019-10-09 DIAGNOSIS — N136 Pyonephrosis: Secondary | ICD-10-CM | POA: Diagnosis not present

## 2019-10-09 DIAGNOSIS — E785 Hyperlipidemia, unspecified: Secondary | ICD-10-CM | POA: Diagnosis not present

## 2019-10-09 DIAGNOSIS — Z20822 Contact with and (suspected) exposure to covid-19: Secondary | ICD-10-CM | POA: Diagnosis present

## 2019-10-09 DIAGNOSIS — N419 Inflammatory disease of prostate, unspecified: Principal | ICD-10-CM | POA: Diagnosis present

## 2019-10-09 DIAGNOSIS — Z79899 Other long term (current) drug therapy: Secondary | ICD-10-CM | POA: Diagnosis not present

## 2019-10-09 DIAGNOSIS — B9620 Unspecified Escherichia coli [E. coli] as the cause of diseases classified elsewhere: Secondary | ICD-10-CM | POA: Diagnosis not present

## 2019-10-09 DIAGNOSIS — Z91018 Allergy to other foods: Secondary | ICD-10-CM | POA: Diagnosis not present

## 2019-10-09 DIAGNOSIS — N029 Recurrent and persistent hematuria with unspecified morphologic changes: Secondary | ICD-10-CM | POA: Diagnosis present

## 2019-10-09 DIAGNOSIS — G44229 Chronic tension-type headache, not intractable: Secondary | ICD-10-CM | POA: Diagnosis present

## 2019-10-09 DIAGNOSIS — F17210 Nicotine dependence, cigarettes, uncomplicated: Secondary | ICD-10-CM | POA: Diagnosis not present

## 2019-10-09 DIAGNOSIS — Q625 Duplication of ureter: Secondary | ICD-10-CM | POA: Diagnosis not present

## 2019-10-09 DIAGNOSIS — I10 Essential (primary) hypertension: Secondary | ICD-10-CM | POA: Diagnosis present

## 2019-10-09 DIAGNOSIS — R300 Dysuria: Secondary | ICD-10-CM | POA: Diagnosis present

## 2019-10-09 DIAGNOSIS — R319 Hematuria, unspecified: Secondary | ICD-10-CM | POA: Diagnosis present

## 2019-10-09 HISTORY — DX: Inflammatory disease of prostate, unspecified: N41.9

## 2019-10-09 HISTORY — DX: Essential (primary) hypertension: I10

## 2019-10-09 HISTORY — DX: Duplication of ureter: Q62.5

## 2019-10-09 LAB — CBC
HCT: 48.4 % (ref 39.0–52.0)
Hemoglobin: 16 g/dL (ref 13.0–17.0)
MCH: 30.7 pg (ref 26.0–34.0)
MCHC: 33.1 g/dL (ref 30.0–36.0)
MCV: 92.9 fL (ref 80.0–100.0)
Platelets: 317 10*3/uL (ref 150–400)
RBC: 5.21 MIL/uL (ref 4.22–5.81)
RDW: 14.6 % (ref 11.5–15.5)
WBC: 18.5 10*3/uL — ABNORMAL HIGH (ref 4.0–10.5)
nRBC: 0 % (ref 0.0–0.2)

## 2019-10-09 LAB — URINALYSIS, ROUTINE W REFLEX MICROSCOPIC
Bilirubin Urine: NEGATIVE
Glucose, UA: NEGATIVE mg/dL
Ketones, ur: NEGATIVE mg/dL
Nitrite: NEGATIVE
Protein, ur: 100 mg/dL — AB
Specific Gravity, Urine: 1.02 (ref 1.005–1.030)
pH: 5 (ref 5.0–8.0)

## 2019-10-09 LAB — BASIC METABOLIC PANEL
Anion gap: 12 (ref 5–15)
BUN: 18 mg/dL (ref 6–20)
CO2: 22 mmol/L (ref 22–32)
Calcium: 9.2 mg/dL (ref 8.9–10.3)
Chloride: 105 mmol/L (ref 98–111)
Creatinine, Ser: 1.15 mg/dL (ref 0.61–1.24)
GFR calc Af Amer: >60 mL/min (ref >60)
GFR calc non Af Amer: >60 mL/min (ref >60)
Glucose, Bld: 95 mg/dL (ref 70–99)
Potassium: 4.1 mmol/L (ref 3.5–5.1)
Sodium: 139 mmol/L (ref 135–145)

## 2019-10-09 MED ORDER — SODIUM CHLORIDE 0.9 % IV SOLN: INTRAVENOUS | Status: DC

## 2019-10-09 MED ORDER — FENTANYL CITRATE (PF) 100 MCG/2ML IJ SOLN
50.0000 ug | Freq: Once | INTRAMUSCULAR | Status: AC
  Administered 2019-10-09: 50 ug via INTRAVENOUS
  Filled 2019-10-09: qty 2

## 2019-10-09 MED ORDER — ONDANSETRON HCL 4 MG/2ML IJ SOLN
4.0000 mg | Freq: Four times a day (QID) | INTRAMUSCULAR | Status: DC | PRN
  Administered 2019-10-10 (×2): 4 mg via INTRAVENOUS
  Filled 2019-10-09 (×2): qty 2

## 2019-10-09 MED ORDER — PHENAZOPYRIDINE HCL 100 MG PO TABS
95.0000 mg | ORAL_TABLET | Freq: Once | ORAL | Status: AC
  Administered 2019-10-09: 100 mg via ORAL
  Filled 2019-10-09: qty 1

## 2019-10-09 MED ORDER — ONDANSETRON HCL 4 MG/2ML IJ SOLN
4.0000 mg | Freq: Once | INTRAMUSCULAR | Status: AC
  Administered 2019-10-09: 4 mg via INTRAVENOUS
  Filled 2019-10-09: qty 2

## 2019-10-09 MED ORDER — ATORVASTATIN CALCIUM 10 MG PO TABS
20.0000 mg | ORAL_TABLET | Freq: Every day | ORAL | Status: DC
  Administered 2019-10-10: 20 mg via ORAL
  Filled 2019-10-09: qty 2

## 2019-10-09 MED ORDER — ONDANSETRON HCL 4 MG PO TABS: 4.0000 mg | ORAL_TABLET | Freq: Four times a day (QID) | ORAL | Status: DC | PRN

## 2019-10-09 MED ORDER — ENOXAPARIN SODIUM 40 MG/0.4ML ~~LOC~~ SOLN: 40.0000 mg | SUBCUTANEOUS | Status: DC

## 2019-10-09 MED ORDER — HYDROCHLOROTHIAZIDE 25 MG PO TABS
12.5000 mg | ORAL_TABLET | Freq: Every evening | ORAL | Status: DC
  Administered 2019-10-10: 12.5 mg via ORAL
  Filled 2019-10-09: qty 1

## 2019-10-09 MED ORDER — OXYCODONE HCL 5 MG PO TABS
5.0000 mg | ORAL_TABLET | ORAL | Status: DC | PRN
  Administered 2019-10-10 (×2): 5 mg via ORAL
  Filled 2019-10-09 (×2): qty 1

## 2019-10-09 MED ORDER — MORPHINE SULFATE (PF) 2 MG/ML IV SOLN
1.0000 mg | INTRAVENOUS | Status: DC | PRN
  Administered 2019-10-10 (×3): 1 mg via INTRAVENOUS
  Filled 2019-10-09 (×3): qty 1

## 2019-10-09 MED ORDER — SODIUM CHLORIDE 0.9 % IV SOLN
1.0000 g | Freq: Once | INTRAVENOUS | Status: AC
  Administered 2019-10-09: 1 g via INTRAVENOUS
  Filled 2019-10-09: qty 10

## 2019-10-09 MED ORDER — KETOROLAC TROMETHAMINE 15 MG/ML IJ SOLN
15.0000 mg | Freq: Once | INTRAMUSCULAR | Status: AC
  Administered 2019-10-09: 15 mg via INTRAVENOUS
  Filled 2019-10-09: qty 1

## 2019-10-09 MED ORDER — SODIUM CHLORIDE 0.9 % IV SOLN
1.0000 g | INTRAVENOUS | Status: DC
  Administered 2019-10-10 – 2019-10-11 (×2): 1 g via INTRAVENOUS
  Filled 2019-10-09: qty 1
  Filled 2019-10-09: qty 10

## 2019-10-09 MED ORDER — PROPRANOLOL HCL ER 60 MG PO CP24
60.0000 mg | ORAL_CAPSULE | Freq: Every evening | ORAL | Status: DC
  Administered 2019-10-10: 60 mg via ORAL
  Filled 2019-10-09 (×2): qty 1

## 2019-10-09 MED ORDER — LACTATED RINGERS IV BOLUS
500.0000 mL | Freq: Once | INTRAVENOUS | Status: AC
  Administered 2019-10-09: 500 mL via INTRAVENOUS

## 2019-10-09 MED ORDER — ACETAMINOPHEN 650 MG RE SUPP: 650.0000 mg | Freq: Four times a day (QID) | RECTAL | Status: DC | PRN

## 2019-10-09 MED ORDER — LACTATED RINGERS IV BOLUS
1000.0000 mL | Freq: Once | INTRAVENOUS | Status: AC
  Administered 2019-10-09: 1000 mL via INTRAVENOUS

## 2019-10-09 MED ORDER — ACETAMINOPHEN 325 MG PO TABS
650.0000 mg | ORAL_TABLET | Freq: Four times a day (QID) | ORAL | Status: DC | PRN
  Administered 2019-10-10 (×2): 650 mg via ORAL
  Filled 2019-10-09 (×2): qty 2

## 2019-10-09 MED ORDER — AMITRIPTYLINE HCL 25 MG PO TABS
100.0000 mg | ORAL_TABLET | Freq: Every evening | ORAL | Status: DC
  Administered 2019-10-10: 100 mg via ORAL
  Filled 2019-10-09: qty 4

## 2019-10-09 NOTE — ED Provider Notes (Signed)
[facility] EMERGENCY DEPARTMENT Provider Note   CSN: [id number] Arrival date & time: 10/09/19  1629     History Chief Complaint  Patient presents with  . Hematuria    Jesus Wells is a 54 y.o. male with history of HTN, duplicate left ureter with chronic obstruction who presents with L flank pain and hematuria. He states symptoms started 2 weeks ago. He went to UC on 2/22 and was diagnosed with pyelonephritis because he had a subjective fever and flank pain. He states the pain is severe and across her lower abdomen, particularly on the left side. He is also having left sided flank pain. UA He was given IM Rocephin and urine culture was sent which grew E.coli. He was given Cipro and Norco which he took and throughout the week he felt better. He states he felt better for about one week and then today had an acute onset of the same symptoms. He denies fever, chills, vomiting. He has some nausea. No hx of kidney stones.  HPI     Past Medical History:  Diagnosis Date  . Hypertension     There are no problems to display for this patient.   Past Surgical History:  Procedure Laterality Date  . BACK SURGERY         Family History  Problem Relation Age of Onset  . Healthy Mother   . Healthy Father     Social History   Tobacco Use  . Smoking status: Current Every Day Smoker    Packs/day: 1.50    Types: Cigarettes  . Smokeless tobacco: Former Engineer, water Use Topics  . Alcohol use: No  . Drug use: No    Home Medications Prior to Admission medications   Medication Sig Start Date End Date Taking? Authorizing Provider  amitriptyline (ELAVIL) 100 MG tablet Take 100 mg by mouth at bedtime.    [provider]  atorvastatin (LIPITOR) 10 MG tablet Take 10 mg by mouth daily.    [provider]  cyclobenzaprine (FLEXERIL) 10 MG tablet Take 1 tablet (10 mg total) by mouth at bedtime as needed for muscle spasms. 03/01/14   Rodolph Bong, MD    ibuprofen (ADVIL,MOTRIN) 200 MG tablet Take 600 mg by mouth every 6 (six) hours as needed (for headaches).    [provider]  loperamide (IMODIUM A-D) 2 MG tablet Take 2 mg by mouth 4 (four) times daily as needed for diarrhea or loose stools.    [provider]  ondansetron (ZOFRAN ODT) 4 MG disintegrating tablet Take 1 tablet (4 mg total) by mouth every 8 (eight) hours as needed for nausea or vomiting. 11/09/16   Gerhard Munch, MD  ondansetron (ZOFRAN) 4 MG tablet Take 1 tablet (4 mg total) by mouth every 6 (six) hours. 09/24/19   Darr, Veryl Speak, PA-C  propranolol (INDERAL) 60 MG tablet Take 60 mg by mouth 2 (two) times daily.    [provider]    Allergies    Onion  Review of Systems   Review of Systems  Constitutional: Negative for chills and fever.  Respiratory: Negative for shortness of breath.   Cardiovascular: Negative for chest pain.  Gastrointestinal: Positive for abdominal pain and nausea. Negative for diarrhea and vomiting.  Genitourinary: Positive for flank pain and hematuria.  All other systems reviewed and are negative.   Physical Exam Updated Vital Signs BP (!) 133/97   Pulse (!) 124   Temp 97.9 F (36.6 C) (Oral)  Resp (!) 24   Ht 6\' 1"  (1.854 m)   Wt 108.9 kg   SpO2 94%   BMI 31.66 kg/m   Physical Exam Vitals and nursing note reviewed.  Constitutional:      General: He is in acute distress (shaking).     Appearance: He is well-developed.     Comments: Uncomfortable, in pain  HENT:     Head: Normocephalic and atraumatic.  Eyes:     General: No scleral icterus.       Right eye: No discharge.        Left eye: No discharge.     Conjunctiva/sclera: Conjunctivae normal.     Pupils: Pupils are equal, round, and reactive to light.  Cardiovascular:     Rate and Rhythm: Tachycardia present.  Pulmonary:     Effort: Pulmonary effort is normal. No respiratory distress.     Breath sounds: Normal breath sounds.  Abdominal:      General: There is no distension.     Palpations: Abdomen is soft.     Tenderness: There is abdominal tenderness (suprapubic and LLQ). There is left CVA tenderness.  Musculoskeletal:     Cervical back: Normal range of motion.  Skin:    General: Skin is warm and dry.  Neurological:     Mental Status: He is alert and oriented to person, place, and time.  Psychiatric:        Behavior: Behavior normal.     ED Results / Procedures / Treatments   Labs (all labs ordered are listed, but only abnormal results are displayed) Labs Reviewed  URINALYSIS, ROUTINE W REFLEX MICROSCOPIC - Abnormal; Notable for the following components:      Result Value   Color, Urine STRAW (*)    APPearance TURBID (*)    Hgb urine dipstick MODERATE (*)    Protein, ur 100 (*)    Leukocytes,Ua SMALL (*)    All other components within normal limits  CBC - Abnormal; Notable for the following components:   WBC 18.5 (*)    All other components within normal limits  CULTURE, BLOOD (ROUTINE X 2)  CULTURE, BLOOD (ROUTINE X 2)  BASIC METABOLIC PANEL  LACTIC ACID, PLASMA  LACTIC ACID, PLASMA  HEPATIC FUNCTION PANEL  LIPASE, BLOOD  CBC WITH DIFFERENTIAL/PLATELET    EKG None  Radiology CT Renal Stone Study  Result Date: 10/09/2019 CLINICAL DATA:  Blood in urine EXAM: CT ABDOMEN AND PELVIS WITHOUT CONTRAST TECHNIQUE: Multidetector CT imaging of the abdomen and pelvis was performed following the standard protocol without IV contrast. COMPARISON:  CT 11/09/2016, 07/07/2008 FINDINGS: Lower chest: Lung bases demonstrate no acute consolidation or effusion. The heart size is within normal limits Hepatobiliary: No focal liver abnormality is seen. No gallstones, gallbladder wall thickening, or biliary dilatation. Pancreas: Unremarkable. No pancreatic ductal dilatation or surrounding inflammatory changes. Spleen: Normal in size without focal abnormality. Adrenals/Urinary Tract: Adrenal glands are within normal limits. The right  kidney shows no hydronephrosis. Duplicated configuration of left kidney with severe hydronephrosis of the upper pole moiety. Duplicated left ureters with hydroureter of upper pole moiety down to the bladder, this is associated with a 2 cm ureterocele. Peri ureteral soft tissue stranding and left peripelvic fat stranding. Negative for stone disease. Cortical thinning of the upper pole moiety. Stomach/Bowel: Stomach is within normal limits. Appendix appears normal. No evidence of bowel wall thickening, distention, or inflammatory changes. Vascular/Lymphatic: Mild aortic atherosclerosis. No aneurysm. No suspicious adenopathy Reproductive: Prostate calcification Other: Negative for free  air or free fluid. Fat containing inguinal hernias Musculoskeletal: Degenerative changes. No acute or suspicious osseous abnormality IMPRESSION: 1. Duplicated configuration of the left kidney and ureters with chronic obstruction of the upper pole moiety as evidenced by severe hydronephrosis and hydroureter down to the bladder with associated 2 cm ureterocele at the bladder. There is left peri ureteral soft tissue stranding and inflammatory change as well as inflammatory change about the left renal pelvis, acute superimposed infectious process is possible. Negative for stone disease. Electronically Signed   By: Jasmine Pang M.D.   On: 10/09/2019 22:36    Procedures Procedures (including critical care time)  Medications Ordered in ED Medications  cefTRIAXone (ROCEPHIN) 1 g in sodium chloride 0.9 % 100 mL IVPB (1 g Intravenous New Bag/Given 10/09/19 2254)  ketorolac (TORADOL) 15 MG/ML injection 15 mg (15 mg Intravenous Given 10/09/19 2219)  fentaNYL (SUBLIMAZE) injection 50 mcg (50 mcg Intravenous Given 10/09/19 2219)  lactated ringers bolus 500 mL (500 mLs Intravenous Bolus from Bag 10/09/19 2225)  ondansetron (ZOFRAN) injection 4 mg (4 mg Intravenous Given 10/09/19 2219)  lactated ringers bolus 1,000 mL (1,000 mLs Intravenous Bolus  from Bag 10/09/19 2255)  phenazopyridine (PYRIDIUM) tablet 100 mg (100 mg Oral Given 10/09/19 2308)    ED Course  I have reviewed the triage vital signs and the nursing notes.  Pertinent labs & imaging results that were available during my care of the patient were reviewed by me and considered in my medical decision making (see chart for details).  54 year old male presents with acute L sided flank pain and hematuria. He was treated for pyelonephritis last week with resolution of symptoms after taking antibiotics. He is significantly tachycardic here with HR in the 120s. EKG is sinus tachycardia. Lungs are CTA. He has L CVA tenderness and LLQ tenderness. CBC shows leukocytosis of 18. BMP is normal. UA looks overall improved with moderate hgb and small leukocytes and 100 protein. Urine culture has grown out E.coli which was sensitive to cephalosporins and cipro so it's unclear why infection has returned. Due to significant pain I'm concerned for a kidney stone. Will obtain CT renal and give fluids, toradol, fentanyl.  CT renal shows duplicate ureter and chronic obstruction with new peri-uretal stranding and inflammatory change around the left renal pelves. No kidney stone was identified. Will give rocephin.  Will admit for pyelonephritis and failure of outpatient tx. Discussed with Dr. Allena Katz with [facility] who will admit.  MDM Rules/Calculators/A&P                       Final Clinical Impression(s) / ED Diagnoses Final diagnoses:  Pyelonephritis of left kidney    Rx / DC Orders ED Discharge Orders    None       Bethel Born, PA-C 10/09/19 2324    Arby Barrette, MD 10/10/19 [phone]

## 2019-10-09 NOTE — H&P (Signed)
History and Physical    THEO KRUMHOLZ UUV:[id number] DOB: 03-03-1966 DOA: 10/09/2019  PCP: Clovis Riley, L.August Saucer, MD  Patient coming from: Home  I have personally briefly reviewed patient's old medical records in [facility]  Chief Complaint: Left flank pain, hematuria, dysuria  HPI: Jesus Wells is a 54 y.o. male with medical history significant for hypertension, hyperlipidemia, and duplicated left ureter with chronic obstruction who presents to the ED for evaluation of left flank pain, hematuria, and dysuria.  Patient reports symptoms beginning about 2 weeks ago. He was seen at urgent care on 09/23/2019 and diagnosed with acute pyelonephritis. He was treated with a 7-day course of ciprofloxacin. Urine culture that visit did grow E. coli which was sensitive to cephalosporins and ciprofloxacin. Patient was feeling well for about 1 week after treatment until symptoms returned today (10/09/2019).  He again began having dysuria and reports seeing blood in his urine at the end of voiding.  He has had abdominal pain, left flank pain, and shortness of breath with deep inspiration.  He denies any chest pain.  He has felt fatigued.  He denies any subjective fevers, chills, or diaphoresis.  He has been nauseous without emesis.  He denies any diarrhea.  ED Course:  Initial vitals showed BP 125/99, pulse 130, RR 18, temp 98.6 Fahrenheit, SPO2 98% on room air.  Labs show WBC 18.5, hemoglobin 16.0, platelets 317,000, sodium 129, potassium 4.1, bicarb 22, BUN 18, creatinine 1.15.  Urinalysis shows negative nitrates, small leukocytes, microscopy not reported out. Blood cultures were obtained and pending.  SARS-CoV-2 PCR is collected and pending.  CT renal stone study shows duplicated configuration of the left kidney and ureters with chronic obstruction of the upper pole as evidenced by severe hydronephrosis and hydroureter down to the bladder with associated 2 cm ureterocele at the bladder. Left periureteral  soft tissue stranding and inflammatory change as well as inflammatory change about the left renal pelvis noted. No stone identified.  Patient was given 1.5 L LR, fentanyl, Toradol, Pyridium, Zofran, and IV ceftriaxone. The hospitalist service was consulted to admit for further evaluation and management.  Review of Systems: All systems reviewed and are negative except as documented in history of present illness above.   Past Medical History:  Diagnosis Date  . Hypertension     Past Surgical History:  Procedure Laterality Date  . BACK SURGERY      Social History:  reports that he has been smoking cigarettes. He has been smoking about 1.50 packs per day. He has quit using smokeless tobacco. He reports that he does not drink alcohol or use drugs.  Allergies  Allergen Reactions  . Onion Anaphylaxis and Swelling    Tongue swells and closes off his airway    Family History  Problem Relation Age of Onset  . Healthy Mother   . Healthy Father      Prior to Admission medications   Medication Sig Start Date End Date Taking? Authorizing Provider  amitriptyline (ELAVIL) 100 MG tablet Take 100 mg by mouth at bedtime.    [provider]  atorvastatin (LIPITOR) 10 MG tablet Take 10 mg by mouth daily.    [provider]  cyclobenzaprine (FLEXERIL) 10 MG tablet Take 1 tablet (10 mg total) by mouth at bedtime as needed for muscle spasms. 03/01/14   Rodolph Bong, MD  ibuprofen (ADVIL,MOTRIN) 200 MG tablet Take 600 mg by mouth every 6 (six) hours as needed (for headaches).    [provider]  loperamide (IMODIUM A-D) 2 MG tablet Take 2 mg by mouth 4 (four) times daily as needed for diarrhea or loose stools.    [provider]  ondansetron (ZOFRAN ODT) 4 MG disintegrating tablet Take 1 tablet (4 mg total) by mouth every 8 (eight) hours as needed for nausea or vomiting. 11/09/16   Carmin Muskrat, MD  ondansetron (ZOFRAN) 4 MG tablet Take 1 tablet (4 mg total) by  mouth every 6 (six) hours. 09/24/19   Darr, Marguerita Beards, PA-C  propranolol (INDERAL) 60 MG tablet Take 60 mg by mouth 2 (two) times daily.    [provider]    Physical Exam: Vitals:   10/09/19 2117 10/09/19 2145 10/09/19 2155 10/09/19 2157  BP: (!) 113/101 (!) 133/97    Pulse: (!) 121  (!) 124   Resp: (!) 24     Temp:    97.9 F (36.6 C)  TempSrc:    Oral  SpO2: 98%  94%   Weight:      Height:       Constitutional: Resting supine in bed, NAD, calm, appears tired and somewhat uncomfortable Eyes: PERRL, lids and conjunctivae normal ENMT: Mucous membranes are dry. Posterior pharynx clear of any exudate or lesions. Neck: normal, supple, no masses. Respiratory: clear to auscultation bilaterally, no wheezing, no crackles. Normal respiratory effort. No accessory muscle use.  Cardiovascular: Tachycardic, no murmurs / rubs / gallops. No extremity edema. 2+ pedal pulses. Abdomen: Generalized tenderness to palpation, no masses palpated. No hepatosplenomegaly. Bowel sounds positive.  Positive left CVA tenderness. Musculoskeletal: no clubbing / cyanosis. No joint deformity upper and lower extremities. Good ROM, no contractures. Normal muscle tone.  Skin: Diaphoretic, no rashes, lesions, ulcers. No induration Neurologic: CN 2-12 grossly intact. Sensation intact, Strength 5/5 in all 4.  Psychiatric: Normal judgment and insight. Alert and oriented x 3. Normal mood.     Labs on Admission: I have personally reviewed following labs and imaging studies  CBC: Recent Labs  Lab 10/09/19 1652  WBC 18.5*  HGB 16.0  HCT 48.4  MCV 92.9  PLT 454   Basic Metabolic Panel: Recent Labs  Lab 10/09/19 1652  NA 139  K 4.1  CL 105  CO2 22  GLUCOSE 95  BUN 18  CREATININE 1.15  CALCIUM 9.2   GFR: Estimated Creatinine Clearance: 97.2 mL/min (by C-G formula based on SCr of 1.15 mg/dL). Liver Function Tests: No results for input(s): AST, ALT, ALKPHOS, BILITOT, PROT, ALBUMIN in the last 168  hours. No results for input(s): LIPASE, AMYLASE in the last 168 hours. No results for input(s): AMMONIA in the last 168 hours. Coagulation Profile: No results for input(s): INR, PROTIME in the last 168 hours. Cardiac Enzymes: No results for input(s): CKTOTAL, CKMB, CKMBINDEX, TROPONINI in the last 168 hours. BNP (last 3 results) No results for input(s): PROBNP in the last 8760 hours. HbA1C: No results for input(s): HGBA1C in the last 72 hours. CBG: No results for input(s): GLUCAP in the last 168 hours. Lipid Profile: No results for input(s): CHOL, HDL, LDLCALC, TRIG, CHOLHDL, LDLDIRECT in the last 72 hours. Thyroid Function Tests: No results for input(s): TSH, T4TOTAL, FREET4, T3FREE, THYROIDAB in the last 72 hours. Anemia Panel: No results for input(s): VITAMINB12, FOLATE, FERRITIN, TIBC, IRON, RETICCTPCT in the last 72 hours. Urine analysis:    Component Value Date/Time   COLORURINE STRAW (A) 10/09/2019 1906   APPEARANCEUR TURBID (A) 10/09/2019 1906   LABSPEC 1.020 10/09/2019 1906   PHURINE 5.0 10/09/2019  1906   GLUCOSEU NEGATIVE 10/09/2019 1906   HGBUR MODERATE (A) 10/09/2019 1906   BILIRUBINUR NEGATIVE 10/09/2019 1906   KETONESUR NEGATIVE 10/09/2019 1906   PROTEINUR 100 (A) 10/09/2019 1906   UROBILINOGEN 1.0 09/24/2019 1601   NITRITE NEGATIVE 10/09/2019 1906   LEUKOCYTESUR SMALL (A) 10/09/2019 1906    Radiological Exams on Admission: CT Renal Stone Study  Result Date: 10/09/2019 CLINICAL DATA:  Blood in urine EXAM: CT ABDOMEN AND PELVIS WITHOUT CONTRAST TECHNIQUE: Multidetector CT imaging of the abdomen and pelvis was performed following the standard protocol without IV contrast. COMPARISON:  CT 11/09/2016, 07/07/2008 FINDINGS: Lower chest: Lung bases demonstrate no acute consolidation or effusion. The heart size is within normal limits Hepatobiliary: No focal liver abnormality is seen. No gallstones, gallbladder wall thickening, or biliary dilatation. Pancreas:  Unremarkable. No pancreatic ductal dilatation or surrounding inflammatory changes. Spleen: Normal in size without focal abnormality. Adrenals/Urinary Tract: Adrenal glands are within normal limits. The right kidney shows no hydronephrosis. Duplicated configuration of left kidney with severe hydronephrosis of the upper pole moiety. Duplicated left ureters with hydroureter of upper pole moiety down to the bladder, this is associated with a 2 cm ureterocele. Peri ureteral soft tissue stranding and left peripelvic fat stranding. Negative for stone disease. Cortical thinning of the upper pole moiety. Stomach/Bowel: Stomach is within normal limits. Appendix appears normal. No evidence of bowel wall thickening, distention, or inflammatory changes. Vascular/Lymphatic: Mild aortic atherosclerosis. No aneurysm. No suspicious adenopathy Reproductive: Prostate calcification Other: Negative for free air or free fluid. Fat containing inguinal hernias Musculoskeletal: Degenerative changes. No acute or suspicious osseous abnormality IMPRESSION: 1. Duplicated configuration of the left kidney and ureters with chronic obstruction of the upper pole moiety as evidenced by severe hydronephrosis and hydroureter down to the bladder with associated 2 cm ureterocele at the bladder. There is left peri ureteral soft tissue stranding and inflammatory change as well as inflammatory change about the left renal pelvis, acute superimposed infectious process is possible. Negative for stone disease. Electronically Signed   By: Jasmine Pang M.D.   On: 10/09/2019 22:36    EKG: Ordered and pending.  Assessment/Plan Principal Problem:   Pyelonephritis of left kidney Active Problems:   Hypertension   Hyperlipidemia  Jesus Wells is a 54 y.o. male with medical history significant for hypertension, hyperlipidemia, and duplicated left ureter with chronic obstruction who is admitted with left-sided pyelonephritis.  Left-sided  pyelonephritis: Recurrent versus outpatient treatment failure of left-sided pyelonephritis. Has known duplicate left ureter with chronic obstruction.  Now with recurrent left flank pain, dysuria, endstream hematuria, and leukocytosis. -Urine culture 09/23/2019 grew E. coli sensitive to cephalosporins and ciprofloxacin -Continue IV ceftriaxone -Follow repeat urine culture and blood cultures -Continue IV fluid hydration overnight -Pain control and antiemetics as needed  Hypertension: Continue propranolol and HCTZ.  Hyperlipidemia: Continue atorvastatin.  Chronic tension headaches: Continue Elavil.  DVT prophylaxis: SCDs given hematuria Code Status: DNR, confirmed with patient Family Communication: Discussed with patient, he has discussed with family Disposition Plan: From home, likely discharge to home pending symptomatic improvement and ability to transition to oral antibiotics Consults called: None Admission status: Observation   Darreld Mclean MD [facility]  If 7PM-7AM, please contact night-coverage [email or web address]  10/09/2019, 11:51 PM

## 2019-10-09 NOTE — ED Triage Notes (Signed)
Pt was seen at [facility] for blood in urine and given medications for kidney stones. States he felt better but never passed the stone. The blood in his urine and returned.

## 2019-10-10 ENCOUNTER — Encounter (HOSPITAL_COMMUNITY): Payer: Self-pay | Admitting: Internal Medicine

## 2019-10-10 DIAGNOSIS — R319 Hematuria, unspecified: Secondary | ICD-10-CM | POA: Diagnosis present

## 2019-10-10 DIAGNOSIS — R300 Dysuria: Secondary | ICD-10-CM | POA: Diagnosis present

## 2019-10-10 DIAGNOSIS — N12 Tubulo-interstitial nephritis, not specified as acute or chronic: Secondary | ICD-10-CM | POA: Diagnosis not present

## 2019-10-10 DIAGNOSIS — Q625 Duplication of ureter: Secondary | ICD-10-CM

## 2019-10-10 LAB — CBC WITH DIFFERENTIAL/PLATELET
Abs Immature Granulocytes: 0.09 10*3/uL — ABNORMAL HIGH (ref 0.00–0.07)
Basophils Absolute: 0.1 10*3/uL (ref 0.0–0.1)
Basophils Relative: 0 %
Eosinophils Absolute: 0.1 10*3/uL (ref 0.0–0.5)
Eosinophils Relative: 1 %
HCT: 45.2 % (ref 39.0–52.0)
Hemoglobin: 14.8 g/dL (ref 13.0–17.0)
Immature Granulocytes: 1 %
Lymphocytes Relative: 15 %
Lymphs Abs: 2.2 10*3/uL (ref 0.7–4.0)
MCH: 30.8 pg (ref 26.0–34.0)
MCHC: 32.7 g/dL (ref 30.0–36.0)
MCV: 94.2 fL (ref 80.0–100.0)
Monocytes Absolute: 1.7 10*3/uL — ABNORMAL HIGH (ref 0.1–1.0)
Monocytes Relative: 12 %
Neutro Abs: 10.7 10*3/uL — ABNORMAL HIGH (ref 1.7–7.7)
Neutrophils Relative %: 71 %
Platelets: 269 10*3/uL (ref 150–400)
RBC: 4.8 MIL/uL (ref 4.22–5.81)
RDW: 14.6 % (ref 11.5–15.5)
WBC: 14.9 10*3/uL — ABNORMAL HIGH (ref 4.0–10.5)
nRBC: 0 % (ref 0.0–0.2)

## 2019-10-10 LAB — CBC
HCT: 41.9 % (ref 39.0–52.0)
Hemoglobin: 13.5 g/dL (ref 13.0–17.0)
MCH: 30.7 pg (ref 26.0–34.0)
MCHC: 32.2 g/dL (ref 30.0–36.0)
MCV: 95.2 fL (ref 80.0–100.0)
Platelets: 236 10*3/uL (ref 150–400)
RBC: 4.4 MIL/uL (ref 4.22–5.81)
RDW: 14.5 % (ref 11.5–15.5)
WBC: 10.6 10*3/uL — ABNORMAL HIGH (ref 4.0–10.5)
nRBC: 0 % (ref 0.0–0.2)

## 2019-10-10 LAB — BASIC METABOLIC PANEL
Anion gap: 10 (ref 5–15)
BUN: 17 mg/dL (ref 6–20)
CO2: 24 mmol/L (ref 22–32)
Calcium: 8.4 mg/dL — ABNORMAL LOW (ref 8.9–10.3)
Chloride: 105 mmol/L (ref 98–111)
Creatinine, Ser: 1.08 mg/dL (ref 0.61–1.24)
GFR calc Af Amer: >60 mL/min (ref >60)
GFR calc non Af Amer: >60 mL/min (ref >60)
Glucose, Bld: 137 mg/dL — ABNORMAL HIGH (ref 70–99)
Potassium: 3.4 mmol/L — ABNORMAL LOW (ref 3.5–5.1)
Sodium: 139 mmol/L (ref 135–145)

## 2019-10-10 LAB — HEPATIC FUNCTION PANEL
ALT: 36 U/L (ref 0–44)
AST: 26 U/L (ref 15–41)
Albumin: 3.9 g/dL (ref 3.5–5.0)
Alkaline Phosphatase: 75 U/L (ref 38–126)
Bilirubin, Direct: 0.2 mg/dL (ref 0.0–0.2)
Indirect Bilirubin: 0.2 mg/dL — ABNORMAL LOW (ref 0.3–0.9)
Total Bilirubin: 0.4 mg/dL (ref 0.3–1.2)
Total Protein: 7.5 g/dL (ref 6.5–8.1)

## 2019-10-10 LAB — SARS CORONAVIRUS 2 (TAT 6-24 HRS): SARS Coronavirus 2: NEGATIVE

## 2019-10-10 LAB — LIPASE, BLOOD: Lipase: 41 U/L (ref 11–51)

## 2019-10-10 LAB — LACTIC ACID, PLASMA: Lactic Acid, Venous: 1.4 mmol/L (ref 0.5–1.9)

## 2019-10-10 LAB — HIV ANTIBODY (ROUTINE TESTING W REFLEX): HIV Screen 4th Generation wRfx: NONREACTIVE

## 2019-10-10 MED ORDER — POTASSIUM CHLORIDE CRYS ER 20 MEQ PO TBCR
40.0000 meq | EXTENDED_RELEASE_TABLET | Freq: Once | ORAL | Status: AC
  Administered 2019-10-10: 40 meq via ORAL
  Filled 2019-10-10: qty 2

## 2019-10-10 MED ORDER — OXYCODONE HCL 5 MG PO TABS
10.0000 mg | ORAL_TABLET | ORAL | Status: DC | PRN
  Administered 2019-10-10: 10 mg via ORAL
  Filled 2019-10-10: qty 2

## 2019-10-10 MED ORDER — SODIUM CHLORIDE 0.9 % IV SOLN: INTRAVENOUS | Status: AC

## 2019-10-10 MED ORDER — TAMSULOSIN HCL 0.4 MG PO CAPS
0.4000 mg | ORAL_CAPSULE | Freq: Every day | ORAL | Status: DC
  Administered 2019-10-10: 0.4 mg via ORAL
  Filled 2019-10-10: qty 1

## 2019-10-10 NOTE — ED Notes (Signed)
Breakfast ordered 

## 2019-10-10 NOTE — ED Notes (Signed)
Report given to [facility], RN, on floor.

## 2019-10-10 NOTE — Progress Notes (Signed)
Progress Note    Jesus Wells  KKX:[id number] DOB: 11-12-1965  DOA: 10/09/2019 PCP: Clovis Riley, L.August Saucer, MD    Brief Narrative:   Chief complaint: Hematuria/dysuria/left lower quadrant pain.  Medical records reviewed and are as summarized below:  Jesus Wells is an 54 y.o. male medical history that includes hypertension hyperlipidemia, duplicated left ureter with chronic obstruction presented to the emergency department March 9 with chief complaint recurrent hematuria/dysuria left lower quadrant pain.  Work-up in the emergency department includes CT renal stone study revealing severe hydronephrosis as well as left periureteral soft tissue stranding and inflammatory change.  Urinalysis with moderate hemoglobin protein leukocytes.  He is provided with pain medicine and Rocephin  Assessment/Plan:   Principal Problem:   Pyelonephritis of left kidney Active Problems:   Hypertension   Hematuria   Dysuria   Hyperlipidemia   Duplicated ureter, left  #1.  Pyelonephritis of left kidney.  Recurrent versus outpatient treatment failure.  Continues with dysuria and hematuria.  He has received Rocephin and pain medicine with only intermittent relief.  He is afebrile hemodynamically stable nontoxic-appearing but obviously uncomfortable.  WBC's trending down.  Urine output good.  Pain medicine is Oxy with morphine IV for breakthrough.  -We will increase oxycodone -Continue IV morphine for breakthrough -Continue Rocephin -Follow culture -As needed Zofran -Vigorous IV fluids -Monitor intake and output -Supportive therapy  #2.  Hypertension.  Fair control.  Home medications include propanolol and hydrochlorothiazide. -Continue home meds -Monitor  #3.  Hyperlipidemia. -Continue home statin  4.  Chronic tension headaches -Continue home Elavil  Family Communication/Anticipated D/C date and plan/Code Status   DVT prophylaxis: Lovenox ordered. Code Status: dnr Family Communication: patient    Disposition Plan: home hopefully to [employer]:    None.   Anti-Infectives:    Rocephin 3/9>>  Subjective:   Sitting on side of bed rocking in pain.  Complains of a little nausea but feeling hungry as well.  Current pain medicine has only intermittent relief.  Objective:    Vitals:   10/09/19 2330 10/10/19 0000 10/10/19 0802 10/10/19 0926  BP: (!) 141/99 (!) 146/87 131/85 123/86  Pulse: (!) 101 97 85 92  Resp: 16 20  16   Temp:   97.8 F (36.6 C) 98 F (36.7 C)  TempSrc:   Oral Oral  SpO2: 95% 94% 98% 97%  Weight:      Height:       No intake or output data in the 24 hours ending 10/10/19 1115 Filed Weights   10/09/19 1642  Weight: 108.9 kg    Exam: General: Well-nourished sitting on side of the bed obviously uncomfortable CV: Regular rate and rhythm no murmur gallop or rub no lower extremity edema Respiratory: No increased work of breathing breath sounds somewhat distant slightly coarse I hear no crackles Abdomen: Soft positive bowel sounds mild tenderness left lower quadrant no guarding or rebounding left flank tender Musculoskeletal: Joints without swelling/erythema full range of motion Neuro: Awake alert oriented x3 speech clear facial symmetry cranial nerves II through XII grossly intact  Data Reviewed:   I have personally reviewed following labs and imaging studies:  Labs: Labs show the following:   Basic Metabolic Panel: Recent Labs  Lab 10/09/19 1652 10/10/19 0500  NA 139 139  K 4.1 3.4*  CL 105 105  CO2 22 24  GLUCOSE 95 137*  BUN 18 17  CREATININE 1.15 1.08  CALCIUM 9.2 8.4*   GFR Estimated Creatinine Clearance:  103.5 mL/min (by C-G formula based on SCr of 1.08 mg/dL). Liver Function Tests: Recent Labs  Lab 10/09/19 2235  AST 26  ALT 36  ALKPHOS 75  BILITOT 0.4  PROT 7.5  ALBUMIN 3.9   Recent Labs  Lab 10/09/19 2235  LIPASE 41   No results for input(s): AMMONIA in the last 168 hours. Coagulation  profile No results for input(s): INR, PROTIME in the last 168 hours.  CBC: Recent Labs  Lab 10/09/19 1652 10/09/19 2250 10/10/19 0500  WBC 18.5* 14.9* 10.6*  NEUTROABS  --  10.7*  --   HGB 16.0 14.8 13.5  HCT 48.4 45.2 41.9  MCV 92.9 94.2 95.2  PLT 317 269 236   Cardiac Enzymes: No results for input(s): CKTOTAL, CKMB, CKMBINDEX, TROPONINI in the last 168 hours. BNP (last 3 results) No results for input(s): PROBNP in the last 8760 hours. CBG: No results for input(s): GLUCAP in the last 168 hours. D-Dimer: No results for input(s): DDIMER in the last 72 hours. Hgb A1c: No results for input(s): HGBA1C in the last 72 hours. Lipid Profile: No results for input(s): CHOL, HDL, LDLCALC, TRIG, CHOLHDL, LDLDIRECT in the last 72 hours. Thyroid function studies: No results for input(s): TSH, T4TOTAL, T3FREE, THYROIDAB in the last 72 hours.  Invalid input(s): FREET3 Anemia work up: No results for input(s): VITAMINB12, FOLATE, FERRITIN, TIBC, IRON, RETICCTPCT in the last 72 hours. Sepsis Labs: Recent Labs  Lab 10/09/19 1652 10/09/19 2245 10/09/19 2250 10/10/19 0500  WBC 18.5*  --  14.9* 10.6*  LATICACIDVEN  --  1.4  --   --     Microbiology Recent Results (from the past 240 hour(s))  Blood culture (routine x 2)     Status: None (Preliminary result)   Collection Time: 10/09/19 10:11 PM   Specimen: BLOOD LEFT HAND  Result Value Ref Range Status   Specimen Description BLOOD LEFT HAND  Final   Special Requests   Final    BOTTLES DRAWN AEROBIC AND ANAEROBIC Blood Culture adequate volume   Culture   Final    NO GROWTH < 12 HOURS Performed at [facility], [place] [address] [place], [place] [address]    Report Status PENDING  Incomplete  Blood culture (routine x 2)     Status: None (Preliminary result)   Collection Time: 10/09/19 10:45 PM   Specimen: BLOOD  Result Value Ref Range Status   Specimen Description BLOOD LEFT ANTECUBITAL  Final   Special Requests   Final     BOTTLES DRAWN AEROBIC AND ANAEROBIC Blood Culture results may not be optimal due to an excessive volume of blood received in culture bottles   Culture   Final    NO GROWTH < 12 HOURS Performed at [facility], [place] [address], [place], [place] [address]    Report Status PENDING  Incomplete  SARS CORONAVIRUS 2 (TAT 6-24 HRS) Nasopharyngeal Nasopharyngeal Swab     Status: None   Collection Time: 10/09/19 11:18 PM   Specimen: Nasopharyngeal Swab  Result Value Ref Range Status   SARS Coronavirus 2 NEGATIVE NEGATIVE Final    Comment: (NOTE) SARS-CoV-2 target nucleic acids are NOT DETECTED. The SARS-CoV-2 RNA is generally detectable in upper and lower respiratory specimens during the acute phase of infection. Negative results do not preclude SARS-CoV-2 infection, do not rule out co-infections with other pathogens, and should not be used as the sole basis for treatment or other patient management decisions. Negative results must be combined with  clinical observations, patient history, and epidemiological information. The expected result is Negative. Fact Sheet for Patients: HairSlick.no Fact Sheet for Healthcare Providers: quierodirigir.com This test is not yet approved or cleared by the [place] FDA and  has been authorized for detection and/or diagnosis of SARS-CoV-2 by FDA under an Emergency Use Authorization (EUA). This EUA will remain  in effect (meaning this test can be used) for the duration of the COVID-19 declaration under Section 56 4(b)(1) of the Act, 21 U.S.C. section 360bbb-3(b)(1), unless the authorization is terminated or revoked sooner. Performed at [facility], [address], [place], [place] [address]     Procedures and diagnostic studies:  CT Renal Stone Study  Result Date: 10/09/2019 CLINICAL DATA:  Blood in urine EXAM: CT ABDOMEN AND PELVIS WITHOUT CONTRAST TECHNIQUE: Multidetector CT imaging  of the abdomen and pelvis was performed following the standard protocol without IV contrast. COMPARISON:  CT 11/09/2016, 07/07/2008 FINDINGS: Lower chest: Lung bases demonstrate no acute consolidation or effusion. The heart size is within normal limits Hepatobiliary: No focal liver abnormality is seen. No gallstones, gallbladder wall thickening, or biliary dilatation. Pancreas: Unremarkable. No pancreatic ductal dilatation or surrounding inflammatory changes. Spleen: Normal in size without focal abnormality. Adrenals/Urinary Tract: Adrenal glands are within normal limits. The right kidney shows no hydronephrosis. Duplicated configuration of left kidney with severe hydronephrosis of the upper pole moiety. Duplicated left ureters with hydroureter of upper pole moiety down to the bladder, this is associated with a 2 cm ureterocele. Peri ureteral soft tissue stranding and left peripelvic fat stranding. Negative for stone disease. Cortical thinning of the upper pole moiety. Stomach/Bowel: Stomach is within normal limits. Appendix appears normal. No evidence of bowel wall thickening, distention, or inflammatory changes. Vascular/Lymphatic: Mild aortic atherosclerosis. No aneurysm. No suspicious adenopathy Reproductive: Prostate calcification Other: Negative for free air or free fluid. Fat containing inguinal hernias Musculoskeletal: Degenerative changes. No acute or suspicious osseous abnormality IMPRESSION: 1. Duplicated configuration of the left kidney and ureters with chronic obstruction of the upper pole moiety as evidenced by severe hydronephrosis and hydroureter down to the bladder with associated 2 cm ureterocele at the bladder. There is left peri ureteral soft tissue stranding and inflammatory change as well as inflammatory change about the left renal pelvis, acute superimposed infectious process is possible. Negative for stone disease. Electronically Signed   By: Jasmine Pang M.D.   On: 10/09/2019 22:36     Medications:   . amitriptyline  100 mg Oral QPM  . atorvastatin  20 mg Oral q1800  . hydrochlorothiazide  12.5 mg Oral QPM  . propranolol ER  60 mg Oral QPM   Continuous Infusions: . sodium chloride 100 mL/hr at 10/10/19 1113  . cefTRIAXone (ROCEPHIN)  IV Stopped (10/10/19 0834)     LOS: 0 days   Gwenyth Bender NP  [facility]   How to contact the [facility] Attending or Consulting provider 7A - 7P or covering provider during after hours 7P -7A, for this patient?  1. Check the care team in [facility] and look for a) attending/consulting [facility] provider listed and b) the [facility] team listed 2. Log into [email or web address] and use [place]'s universal password to access. If you do not have the password, please contact the hospital operator. 3. Locate the [facility] provider you are looking for under [facility] and page to a number that you can be directly reached. 4. If you still have difficulty reaching the provider, please page the [facility] (Director on Call) for  the Hospitalists listed on amion for assistance.  10/10/2019, 11:15 AM

## 2019-10-11 ENCOUNTER — Encounter (HOSPITAL_COMMUNITY): Payer: Self-pay | Admitting: Internal Medicine

## 2019-10-11 DIAGNOSIS — I10 Essential (primary) hypertension: Secondary | ICD-10-CM | POA: Diagnosis not present

## 2019-10-11 DIAGNOSIS — N410 Acute prostatitis: Secondary | ICD-10-CM

## 2019-10-11 DIAGNOSIS — Q625 Duplication of ureter: Secondary | ICD-10-CM | POA: Diagnosis not present

## 2019-10-11 DIAGNOSIS — N419 Inflammatory disease of prostate, unspecified: Secondary | ICD-10-CM | POA: Diagnosis present

## 2019-10-11 LAB — CBC
HCT: 40.6 % (ref 39.0–52.0)
Hemoglobin: 13.1 g/dL (ref 13.0–17.0)
MCH: 30.4 pg (ref 26.0–34.0)
MCHC: 32.3 g/dL (ref 30.0–36.0)
MCV: 94.2 fL (ref 80.0–100.0)
Platelets: 233 10*3/uL (ref 150–400)
RBC: 4.31 MIL/uL (ref 4.22–5.81)
RDW: 14.5 % (ref 11.5–15.5)
WBC: 7.1 10*3/uL (ref 4.0–10.5)
nRBC: 0 % (ref 0.0–0.2)

## 2019-10-11 LAB — BASIC METABOLIC PANEL
Anion gap: 9 (ref 5–15)
BUN: 10 mg/dL (ref 6–20)
CO2: 24 mmol/L (ref 22–32)
Calcium: 8.6 mg/dL — ABNORMAL LOW (ref 8.9–10.3)
Chloride: 105 mmol/L (ref 98–111)
Creatinine, Ser: 0.96 mg/dL (ref 0.61–1.24)
GFR calc Af Amer: >60 mL/min (ref >60)
GFR calc non Af Amer: >60 mL/min (ref >60)
Glucose, Bld: 95 mg/dL (ref 70–99)
Potassium: 4.5 mmol/L (ref 3.5–5.1)
Sodium: 138 mmol/L (ref 135–145)

## 2019-10-11 LAB — URINE CULTURE: Culture: >=100000 — AB

## 2019-10-11 MED ORDER — CIPROFLOXACIN HCL 500 MG PO TABS: 500.0000 mg | ORAL_TABLET | Freq: Two times a day (BID) | ORAL | 0 refills | Status: AC

## 2019-10-11 MED ORDER — TAMSULOSIN HCL 0.4 MG PO CAPS: 0.4000 mg | ORAL_CAPSULE | Freq: Every day | ORAL | 0 refills | Status: DC

## 2019-10-11 MED FILL — CIPROFLOXACIN HCL 500 MG TA: 500 | 14 days supply | Qty: 28 | Fill #0

## 2019-10-11 MED FILL — TAMSULOSIN HCL 0.4 MG CAP: 0.4 | 30 days supply | Qty: 30 | Fill #0

## 2019-10-11 NOTE — Discharge Summary (Signed)
Physician Discharge Summary  Jesus Wells ZOX:[id number] DOB: 06-17-1966 DOA: 10/09/2019  PCP: Clovis Riley, L.August Saucer, MD  Admit date: 10/09/2019 Discharge date: 10/11/2019  Admitted From: home Discharge disposition: home   Recommendations for Outpatient Follow-Up:   1. Follow-up with urology as scheduled April 7 at 1230 for evaluation of resolution of prostatitis as well as duplicated configuration of left kidney and ureters with chronic obstruction as noted in renal CT.  2. Medication as instructed   Discharge Diagnosis:   Principal Problem:   Prostatitis Active Problems:   Pyelonephritis of left kidney   Hypertension   Hematuria   Dysuria   Hyperlipidemia   Duplicated ureter, left    Discharge Condition: Improved.  Diet recommendation: Low sodium, heart healthy.  Carbohydrate-modified.    Wound care: None.  Code status: Full.   History of Present Illness:   Jesus Wells is a 54 y.o. male with medical history significant for hypertension, hyperlipidemia, and duplicated left ureter with chronic obstruction who presented to the ED 3/9 for evaluation of left flank pain, hematuria, and dysuria.  Patient reported symptoms beginning about 2 weeks prior. He was seen at urgent care on 09/23/2019 and diagnosed with acute pyelonephritis. He was treated with a 7-day course of ciprofloxacin. Urine culture that visit did grow E. coli which was sensitive to cephalosporins and ciprofloxacin. Patient was feeling well for about 1 week after treatment until symptoms returned. (10/09/2019).  He again began having dysuria and reported seeing blood in his urine at the end of voiding.  He had abdominal pain, left flank pain, and shortness of breath with deep inspiration.  He denied any chest pain.  He felt fatigued.  He denied any subjective fevers, chills, or diaphoresis.  He had been nauseous without emesis.  He denied any diarrhea.   Hospital Course by Problem:   #1. Prostatitis.  Recurrent versus outpatient treatment failure. Pain worse with sitting.  He  received Rocephin and pain medicine.  He remained afebrile hemodynamically stable nontoxic-appearing. WBC's within the limits of normal at discharge.  Has required no pain medicine since yesterday afternoon.  Will discharge with 2 weeks Cipro.  Has follow-up appointment with urology as noted above.  #2.  Hypertension.  Fair control.  Home medications include propanolol and hydrochlorothiazide.  #3.  Hyperlipidemia. -Continue home statin  4.  Chronic tension headaches -Continue home Elavil  #5.  Pyelonephritis of left kidney per renal CT.  Renal CT noted duplicated configuration of the left kidney and ureters with chronic obstruction of the upper pole as evidenced by severe hydronephrosis and Hydro ureter down to the bladder with associated to centimeter ureterocele at the bladder. urinalysis positive for blood on admission.  On day of discharge patient's dysuria is resolved.  No obvious bleeding with urination.  Urine output is good.  Follow-up with urology as noted above.    Medical Consultants:      Discharge Exam:   Vitals:   10/11/19 0042 10/11/19 0649  BP: 117/84 127/82  Pulse: 88 88  Resp: 18 18  Temp: 98 F (36.7 C) 98.1 F (36.7 C)  SpO2: 96% 95%   Vitals:   10/10/19 1157 10/10/19 1736 10/11/19 0042 10/11/19 0649  BP: 132/88 (!) 143/84 117/84 127/82  Pulse: 87 88 88 88  Resp: 16 16 18 18   Temp: 98.3 F (36.8 C) 98.1 F (36.7 C) 98 F (36.7 C) 98.1 F (36.7 C)  TempSrc: Oral Oral Oral Oral  SpO2: 98% 97% 96%  95%  Weight:      Height:        General exam: Appears calm and comfortable.  Respiratory system: Clear to auscultation. Respiratory effort normal. Cardiovascular system: S1 & S2 heard, RRR. No JVD,  rubs, gallops or clicks. No murmurs. Gastrointestinal system: Abdomen is nondistended, soft and nontender. No organomegaly or masses felt. Normal bowel sounds heard. Central  nervous system: Alert and oriented. No focal neurological deficits. Extremities: No clubbing,  or cyanosis. No edema. Skin: No rashes, lesions or ulcers. Psychiatry: Judgement and insight appear normal. Mood & affect appropriate.    The results of significant diagnostics from this hospitalization (including imaging, microbiology, ancillary and laboratory) are listed below for reference.     Procedures and Diagnostic Studies:   CT Renal Stone Study  Result Date: 10/09/2019 CLINICAL DATA:  Blood in urine EXAM: CT ABDOMEN AND PELVIS WITHOUT CONTRAST TECHNIQUE: Multidetector CT imaging of the abdomen and pelvis was performed following the standard protocol without IV contrast. COMPARISON:  CT 11/09/2016, 07/07/2008 FINDINGS: Lower chest: Lung bases demonstrate no acute consolidation or effusion. The heart size is within normal limits Hepatobiliary: No focal liver abnormality is seen. No gallstones, gallbladder wall thickening, or biliary dilatation. Pancreas: Unremarkable. No pancreatic ductal dilatation or surrounding inflammatory changes. Spleen: Normal in size without focal abnormality. Adrenals/Urinary Tract: Adrenal glands are within normal limits. The right kidney shows no hydronephrosis. Duplicated configuration of left kidney with severe hydronephrosis of the upper pole moiety. Duplicated left ureters with hydroureter of upper pole moiety down to the bladder, this is associated with a 2 cm ureterocele. Peri ureteral soft tissue stranding and left peripelvic fat stranding. Negative for stone disease. Cortical thinning of the upper pole moiety. Stomach/Bowel: Stomach is within normal limits. Appendix appears normal. No evidence of bowel wall thickening, distention, or inflammatory changes. Vascular/Lymphatic: Mild aortic atherosclerosis. No aneurysm. No suspicious adenopathy Reproductive: Prostate calcification Other: Negative for free air or free fluid. Fat containing inguinal hernias Musculoskeletal:  Degenerative changes. No acute or suspicious osseous abnormality IMPRESSION: 1. Duplicated configuration of the left kidney and ureters with chronic obstruction of the upper pole moiety as evidenced by severe hydronephrosis and hydroureter down to the bladder with associated 2 cm ureterocele at the bladder. There is left peri ureteral soft tissue stranding and inflammatory change as well as inflammatory change about the left renal pelvis, acute superimposed infectious process is possible. Negative for stone disease. Electronically Signed   By: Donavan Foil M.D.   On: 10/09/2019 22:36     Labs:   Basic Metabolic Panel: Recent Labs  Lab 10/09/19 1652 10/09/19 1652 10/10/19 0500 10/11/19 0551  NA 139  --  139 138  K 4.1   < > 3.4* 4.5  CL 105  --  105 105  CO2 22  --  24 24  GLUCOSE 95  --  137* 95  BUN 18  --  17 10  CREATININE 1.15  --  1.08 0.96  CALCIUM 9.2  --  8.4* 8.6*   < > = values in this interval not displayed.   GFR Estimated Creatinine Clearance: 116.5 mL/min (by C-G formula based on SCr of 0.96 mg/dL). Liver Function Tests: Recent Labs  Lab 10/09/19 2235  AST 26  ALT 36  ALKPHOS 75  BILITOT 0.4  PROT 7.5  ALBUMIN 3.9   Recent Labs  Lab 10/09/19 2235  LIPASE 41   No results for input(s): AMMONIA in the last 168 hours. Coagulation profile No results for input(s):  INR, PROTIME in the last 168 hours.  CBC: Recent Labs  Lab 10/09/19 1652 10/09/19 2250 10/10/19 0500 10/11/19 0551  WBC 18.5* 14.9* 10.6* 7.1  NEUTROABS  --  10.7*  --   --   HGB 16.0 14.8 13.5 13.1  HCT 48.4 45.2 41.9 40.6  MCV 92.9 94.2 95.2 94.2  PLT 317 269 236 233   Cardiac Enzymes: No results for input(s): CKTOTAL, CKMB, CKMBINDEX, TROPONINI in the last 168 hours. BNP: Invalid input(s): POCBNP CBG: No results for input(s): GLUCAP in the last 168 hours. D-Dimer No results for input(s): DDIMER in the last 72 hours. Hgb A1c No results for input(s): HGBA1C in the last 72  hours. Lipid Profile No results for input(s): CHOL, HDL, LDLCALC, TRIG, CHOLHDL, LDLDIRECT in the last 72 hours. Thyroid function studies No results for input(s): TSH, T4TOTAL, T3FREE, THYROIDAB in the last 72 hours.  Invalid input(s): FREET3 Anemia work up No results for input(s): VITAMINB12, FOLATE, FERRITIN, TIBC, IRON, RETICCTPCT in the last 72 hours. Microbiology Recent Results (from the past 240 hour(s))  Culture, Urine     Status: Abnormal   Collection Time: 10/09/19  7:29 PM   Specimen: Urine, Random  Result Value Ref Range Status   Specimen Description URINE, RANDOM  Final   Special Requests   Final    NONE Performed at [facility], [address], [place], [place] [address]    Culture >=100,000 COLONIES/mL ESCHERICHIA COLI (A)  Final   Report Status 10/11/2019 FINAL  Final   Organism ID, Bacteria ESCHERICHIA COLI (A)  Final      Susceptibility   Escherichia coli - MIC*    AMPICILLIN >=32 RESISTANT Resistant     CEFAZOLIN <=4 SENSITIVE Sensitive     CEFTRIAXONE <=0.25 SENSITIVE Sensitive     CIPROFLOXACIN <=0.25 SENSITIVE Sensitive     GENTAMICIN <=1 SENSITIVE Sensitive     IMIPENEM <=0.25 SENSITIVE Sensitive     NITROFURANTOIN <=16 SENSITIVE Sensitive     TRIMETH/SULFA >=320 RESISTANT Resistant     AMPICILLIN/SULBACTAM >=32 RESISTANT Resistant     PIP/TAZO <=4 SENSITIVE Sensitive     * >=100,000 COLONIES/mL ESCHERICHIA COLI  Blood culture (routine x 2)     Status: None (Preliminary result)   Collection Time: 10/09/19 10:11 PM   Specimen: BLOOD LEFT HAND  Result Value Ref Range Status   Specimen Description BLOOD LEFT HAND  Final   Special Requests   Final    BOTTLES DRAWN AEROBIC AND ANAEROBIC Blood Culture adequate volume   Culture   Final    NO GROWTH 2 DAYS Performed at [facility], [address] [place], [place] [address]    Report Status PENDING  Incomplete  Blood culture (routine x 2)     Status: None (Preliminary result)   Collection  Time: 10/09/19 10:45 PM   Specimen: BLOOD  Result Value Ref Range Status   Specimen Description BLOOD LEFT ANTECUBITAL  Final   Special Requests   Final    BOTTLES DRAWN AEROBIC AND ANAEROBIC Blood Culture results may not be optimal due to an excessive volume of blood received in culture bottles   Culture   Final    NO GROWTH 2 DAYS Performed at [facility], [address], [place], [place] [address]    Report Status PENDING  Incomplete  SARS CORONAVIRUS 2 (TAT 6-24 HRS) Nasopharyngeal Nasopharyngeal Swab     Status: None   Collection Time: 10/09/19 11:18 PM   Specimen:  Nasopharyngeal Swab  Result Value Ref Range Status   SARS Coronavirus 2 NEGATIVE NEGATIVE Final    Comment: (NOTE) SARS-CoV-2 target nucleic acids are NOT DETECTED. The SARS-CoV-2 RNA is generally detectable in upper and lower respiratory specimens during the acute phase of infection. Negative results do not preclude SARS-CoV-2 infection, do not rule out co-infections with other pathogens, and should not be used as the sole basis for treatment or other patient management decisions. Negative results must be combined with clinical observations, patient history, and epidemiological information. The expected result is Negative. Fact Sheet for Patients: HairSlick.no Fact Sheet for Healthcare Providers: quierodirigir.com This test is not yet approved or cleared by the [place] FDA and  has been authorized for detection and/or diagnosis of SARS-CoV-2 by FDA under an Emergency Use Authorization (EUA). This EUA will remain  in effect (meaning this test can be used) for the duration of the COVID-19 declaration under Section 56 4(b)(1) of the Act, 21 U.S.C. section 360bbb-3(b)(1), unless the authorization is terminated or revoked sooner. Performed at [facility], [address], [place], [place] [address]      Discharge Instructions:   Discharge  Instructions    Call MD for:  persistant dizziness or light-headedness   Complete by: As directed    Call MD for:  persistant nausea and vomiting   Complete by: As directed    Call MD for:  severe uncontrolled pain   Complete by: As directed    Call MD for:  temperature >100.4   Complete by: As directed    Diet - low sodium heart healthy   Complete by: As directed    Discharge instructions   Complete by: As directed    Take medications as prescribed until all gone Follow up with Dr. Retta Diones at [facility] April 7 at 12:30.   Increase activity slowly   Complete by: As directed      Allergies as of 10/11/2019      Reactions   Onion Anaphylaxis, Swelling   Tongue swells and closes off his airway      Medication List    TAKE these medications   amitriptyline 100 MG tablet Commonly known as: ELAVIL Take 100 mg by mouth every evening.   atorvastatin 20 MG tablet Commonly known as: LIPITOR Take 20 mg by mouth daily at 6 PM.   ciprofloxacin 500 MG tablet Commonly known as: Cipro Take 1 tablet (500 mg total) by mouth 2 (two) times daily for 14 days.   hydrochlorothiazide 12.5 MG tablet Commonly known as: HYDRODIURIL Take 12.5 mg by mouth every evening.   propranolol ER 60 MG 24 hr capsule Commonly known as: INDERAL LA Take 60 mg by mouth every evening.   tamsulosin 0.4 MG Caps capsule Commonly known as: FLOMAX Take 1 capsule (0.4 mg total) by mouth daily after supper.         Time coordinating discharge: 35 minutes  Signed:  Gwenyth Bender NP  [facility] 10/11/2019, 9:55 AM

## 2019-10-11 NOTE — Progress Notes (Signed)
Discharge instructions reviewed. Meds, diet, activity and follow up appointments reviewed and all questions answered. Copy of instructions given to patient and prescriptions sent to pharmacy.

## 2019-10-11 NOTE — Discharge Instructions (Signed)
Pyelonephritis, Adult  Pyelonephritis is an infection that occurs in the kidney. The kidneys are organs that help clean the blood by moving waste out of the blood and into the pee (urine). This infection can happen quickly, or it can last for a long time. In most cases, it clears up with treatment and does not cause other problems. What are the causes? This condition may be caused by:  Germs (bacteria) going from the bladder up to the kidney. This may happen after having a bladder infection.  Germs going from the blood to the kidney. What increases the risk? This condition is more likely to develop in:  Pregnant women.  Older people.  People who have any of these conditions: ? Diabetes. ? Inflammation of the prostate gland (prostatitis), in males. ? Kidney stones or bladder stones. ? Other problems with the kidney or the parts of your body that carry pee from the kidneys to the bladder (ureters). ? Cancer.  People who have a small, thin tube (catheter) placed in the bladder.  People who are sexually active.  Women who use a medicine that kills sperm (spermicide) to prevent pregnancy.  People who have had a prior urinary tract infection (UTI). What are the signs or symptoms? Symptoms of this condition include:  Peeing often.  A strong urge to pee right away.  Burning or stinging when peeing.  Belly pain.  Back pain.  Pain in the side (flank area).  Fever or chills.  Blood in the pee, or dark pee.  Feeling sick to your stomach (nauseous) or throwing up (vomiting). How is this treated? This condition may be treated by:  Taking antibiotic medicines by mouth (orally).  Drinking enough fluids. If the infection is bad, you may need to stay in the hospital. You may be given antibiotics and fluids that are put directly into a vein through an IV tube. In some cases, other treatments may be needed. Follow these instructions at home: Medicines  Take your antibiotic  medicine as told by your doctor. Do not stop taking the antibiotic even if you start to feel better.  Take over-the-counter and prescription medicines only as told by your doctor. General instructions   Drink enough fluid to keep your pee pale yellow.  Avoid caffeine, tea, and carbonated drinks.  Pee (urinate) often. Avoid holding in pee for long periods of time.  Pee before and after sex.  After pooping (having a bowel movement), women should wipe from front to back. Use each tissue only once.  Keep all follow-up visits as told by your doctor. This is important. Contact a doctor if:  You do not feel better after 2 days.  Your symptoms get worse.  You have a fever. Get help right away if:  You cannot take your medicine or drink fluids as told.  You have chills and shaking.  You throw up.  You have very bad pain in your side or back.  You feel very weak or you pass out (faint). Summary  Pyelonephritis is an infection that occurs in the kidney.  In most cases, this infection clears up with treatment and does not cause other problems.  Take your antibiotic medicine as told by your doctor. Do not stop taking the antibiotic even if you start to feel better.  Drink enough fluid to keep your pee pale yellow. This information is not intended to replace advice given to you by your health care provider. Make sure you discuss any questions you have with   your health care provider. Document Revised: 05/23/2018 Document Reviewed: 05/23/2018 Elsevier Patient Education  2020 [employer]  

## 2019-10-14 LAB — CULTURE, BLOOD (ROUTINE X 2)
Culture: NO GROWTH
Culture: NO GROWTH
Special Requests: ADEQUATE

## 2019-11-09 ENCOUNTER — Other Ambulatory Visit: Payer: Self-pay | Admitting: Urology

## 2019-11-09 ENCOUNTER — Other Ambulatory Visit (HOSPITAL_COMMUNITY): Payer: Self-pay | Admitting: Urology

## 2019-11-09 DIAGNOSIS — N1330 Unspecified hydronephrosis: Secondary | ICD-10-CM

## 2019-11-23 ENCOUNTER — Encounter (HOSPITAL_COMMUNITY)
Admission: RE | Admit: 2019-11-23 | Discharge: 2019-11-23 | Disposition: A | Discharge status: Home / Self Care | Payer: 59 | Source: Ambulatory Visit | Attending: Urology | Admitting: Urology

## 2019-11-23 ENCOUNTER — Other Ambulatory Visit: Payer: Self-pay

## 2019-11-23 DIAGNOSIS — N1330 Unspecified hydronephrosis: Secondary | ICD-10-CM | POA: Diagnosis present

## 2019-11-23 MED ORDER — FUROSEMIDE 10 MG/ML IJ SOLN
INTRAMUSCULAR | Status: AC
  Filled 2019-11-23: qty 8

## 2019-11-23 MED ORDER — TECHNETIUM TC 99M MERTIATIDE
5.0000 | Freq: Once | INTRAVENOUS | Status: AC
  Administered 2019-11-23: 5 via INTRAVENOUS

## 2019-12-11 ENCOUNTER — Other Ambulatory Visit: Payer: Self-pay | Admitting: Urology

## 2019-12-21 ENCOUNTER — Other Ambulatory Visit: Payer: Self-pay

## 2019-12-21 ENCOUNTER — Encounter (HOSPITAL_BASED_OUTPATIENT_CLINIC_OR_DEPARTMENT_OTHER): Payer: Self-pay | Admitting: Urology

## 2019-12-21 NOTE — Progress Notes (Signed)
Spoke w/ via phone for pre-op interview---patient Lab needs dos----   I stat 8, ekg           COVID test ------12-24-2019@1440  Arrive at -------630 am 12-27-2019 NPO after ------midnight Medications to take morning of surgery -----none Diabetic medication -----n/a Patient Special Instructions -----none Pre-Op special Istructions -----none Patient verbalized understanding of instructions that were given at this phone interview. Patient denies shortness of breath, chest pain, fever, cough a this phone interview.

## 2019-12-24 ENCOUNTER — Other Ambulatory Visit (HOSPITAL_COMMUNITY)
Admission: RE | Admit: 2019-12-24 | Discharge: 2019-12-24 | Disposition: A | Discharge status: Home / Self Care | Payer: 59 | Source: Ambulatory Visit | Attending: Urology | Admitting: Urology

## 2019-12-24 DIAGNOSIS — Z01812 Encounter for preprocedural laboratory examination: Secondary | ICD-10-CM | POA: Insufficient documentation

## 2019-12-24 DIAGNOSIS — Z20822 Contact with and (suspected) exposure to covid-19: Secondary | ICD-10-CM | POA: Diagnosis not present

## 2019-12-24 LAB — SARS CORONAVIRUS 2 (TAT 6-24 HRS): SARS Coronavirus 2: NEGATIVE

## 2019-12-26 NOTE — H&P (Signed)
H&P  Chief Complaint: Left kidney obstruction  History of Present Illness: 54 yo male presents for cystoscopy, TUI of a left ureterocele.  Past Medical History:  Diagnosis Date  . Duplicated ureter, left    with chronic obstruction  . GERD (gastroesophageal reflux disease)   . Headache   . Hypertension   . Prostatitis   . Pyelonephritis 10/2019    Past Surgical History:  Procedure Laterality Date  . BACK SURGERY  2011   lower  . HAND SURGERY Right 35 yrs ago    Home Medications:  Allergies as of 12/26/2019      Reactions   Onion Anaphylaxis, Swelling   Tongue swells and closes off his airway      Medication List    Notice   Cannot display discharge medications because the patient has not yet been admitted.     Allergies:  Allergies  Allergen Reactions  . Onion Anaphylaxis and Swelling    Tongue swells and closes off his airway    Family History  Problem Relation Age of Onset  . Healthy Mother   . Healthy Father     Social History:  reports that he has been smoking cigarettes. He has a 45.00 pack-year smoking history. He has quit using smokeless tobacco. He reports current drug use. Drug: Marijuana. He reports that he does not drink alcohol.  ROS: A complete review of systems was performed.  All systems are negative except for pertinent findings as noted.  Physical Exam:  Vital signs in last 24 hours: Ht 6\' 1"  (1.854 m)   Wt 108.9 kg   BMI 31.66 kg/m  Constitutional:  Alert and oriented, No acute distress Cardiovascular: Regular rate  Respiratory: Normal respiratory effort GI: Abdomen is soft, nontender, nondistended, no abdominal masses. No CVAT.  Genitourinary: Normal male phallus, testes are descended bilaterally and non-tender and without masses, scrotum is normal in appearance without lesions or masses, perineum is normal on inspection. Lymphatic: No lymphadenopathy Neurologic: Grossly intact, no focal deficits Psychiatric: Normal mood and  affect  Laboratory Data:  No results for input(s): WBC, HGB, HCT, PLT in the last 72 hours.  No results for input(s): NA, K, CL, GLUCOSE, BUN, CALCIUM, CREATININE in the last 72 hours.  Invalid input(s): CO3   No results found for this or any previous visit (from the past 24 hour(s)). Recent Results (from the past 240 hour(s))  SARS CORONAVIRUS 2 (TAT 6-24 HRS) Nasopharyngeal Nasopharyngeal Swab     Status: None   Collection Time: 12/24/19  2:47 PM   Specimen: Nasopharyngeal Swab  Result Value Ref Range Status   SARS Coronavirus 2 NEGATIVE NEGATIVE Final    Comment: (NOTE) SARS-CoV-2 target nucleic acids are NOT DETECTED. The SARS-CoV-2 RNA is generally detectable in upper and lower respiratory specimens during the acute phase of infection. Negative results do not preclude SARS-CoV-2 infection, do not rule out co-infections with other pathogens, and should not be used as the sole basis for treatment or other patient management decisions. Negative results must be combined with clinical observations, patient history, and epidemiological information. The expected result is Negative. Fact Sheet for Patients: 12/26/19 Fact Sheet for Healthcare Providers: HairSlick.no This test is not yet approved or cleared by the quierodirigir.com FDA and  has been authorized for detection and/or diagnosis of SARS-CoV-2 by FDA under an Emergency Use Authorization (EUA). This EUA will remain  in effect (meaning this test can be used) for the duration of the COVID-19 declaration under Section 56 4(b)(1)  of the Act, 21 U.S.C. section 360bbb-3(b)(1), unless the authorization is terminated or revoked sooner. Performed at [facility], [place] [address] [place], [place] [address]     Renal Function: No results for input(s): CREATININE in the last 168 hours. CrCl cannot be calculated (Patient's most recent lab result is older than the  maximum 21 days allowed.).  Radiologic Imaging: No results found.  Impression/Assessment:  Left ureterocele with obstruction of upper pole moeity  Plan:  Cysto, transurethral incision of ureterocele

## 2019-12-27 ENCOUNTER — Encounter (HOSPITAL_BASED_OUTPATIENT_CLINIC_OR_DEPARTMENT_OTHER)
Admission: RE | Disposition: A | Discharge status: Home / Self Care | Payer: Self-pay | Source: Home / Self Care | Attending: Urology

## 2019-12-27 ENCOUNTER — Ambulatory Visit (HOSPITAL_BASED_OUTPATIENT_CLINIC_OR_DEPARTMENT_OTHER)
Admission: RE | Admit: 2019-12-27 | Discharge: 2019-12-27 | Disposition: A | Discharge status: Home / Self Care | Payer: 59 | Attending: Urology | Admitting: Urology

## 2019-12-27 ENCOUNTER — Ambulatory Visit (HOSPITAL_BASED_OUTPATIENT_CLINIC_OR_DEPARTMENT_OTHER): Payer: 59 | Admitting: Certified Registered"

## 2019-12-27 ENCOUNTER — Other Ambulatory Visit: Payer: Self-pay

## 2019-12-27 ENCOUNTER — Encounter (HOSPITAL_BASED_OUTPATIENT_CLINIC_OR_DEPARTMENT_OTHER): Payer: Self-pay | Admitting: Urology

## 2019-12-27 DIAGNOSIS — F17210 Nicotine dependence, cigarettes, uncomplicated: Secondary | ICD-10-CM | POA: Insufficient documentation

## 2019-12-27 DIAGNOSIS — I10 Essential (primary) hypertension: Secondary | ICD-10-CM | POA: Diagnosis not present

## 2019-12-27 DIAGNOSIS — N2889 Other specified disorders of kidney and ureter: Secondary | ICD-10-CM | POA: Insufficient documentation

## 2019-12-27 HISTORY — DX: Headache, unspecified: R51.9

## 2019-12-27 HISTORY — PX: CYSTOSCOPY W/ RETROGRADES: SHX1426

## 2019-12-27 HISTORY — DX: Gastro-esophageal reflux disease without esophagitis: K21.9

## 2019-12-27 LAB — POCT I-STAT, CHEM 8
BUN: 16 mg/dL (ref 6–20)
Calcium, Ion: 1.22 mmol/L (ref 1.15–1.40)
Chloride: 105 mmol/L (ref 98–111)
Creatinine, Ser: 0.9 mg/dL (ref 0.61–1.24)
Glucose, Bld: 104 mg/dL — ABNORMAL HIGH (ref 70–99)
HCT: 45 % (ref 39.0–52.0)
Hemoglobin: 15.3 g/dL (ref 13.0–17.0)
Potassium: 3.4 mmol/L — ABNORMAL LOW (ref 3.5–5.1)
Sodium: 141 mmol/L (ref 135–145)
TCO2: 25 mmol/L (ref 22–32)

## 2019-12-27 SURGERY — CYSTOSCOPY, WITH RETROGRADE PYELOGRAM
Anesthesia: General | Site: Ureter | Laterality: Left

## 2019-12-27 MED ORDER — LIDOCAINE HCL (CARDIAC) PF 100 MG/5ML IV SOSY
PREFILLED_SYRINGE | INTRAVENOUS | Status: DC | PRN
  Administered 2019-12-27: 100 mg via INTRAVENOUS

## 2019-12-27 MED ORDER — ONDANSETRON HCL 4 MG/2ML IJ SOLN
INTRAMUSCULAR | Status: AC
  Filled 2019-12-27: qty 2

## 2019-12-27 MED ORDER — LACTATED RINGERS IV SOLN: INTRAVENOUS | Status: DC

## 2019-12-27 MED ORDER — KETOROLAC TROMETHAMINE 30 MG/ML IJ SOLN
INTRAMUSCULAR | Status: DC | PRN
  Administered 2019-12-27: 30 mg via INTRAVENOUS

## 2019-12-27 MED ORDER — LIDOCAINE 2% (20 MG/ML) 5 ML SYRINGE
INTRAMUSCULAR | Status: AC
  Filled 2019-12-27: qty 5

## 2019-12-27 MED ORDER — FENTANYL CITRATE (PF) 100 MCG/2ML IJ SOLN
INTRAMUSCULAR | Status: DC | PRN
  Administered 2019-12-27 (×3): 25 ug via INTRAVENOUS
  Administered 2019-12-27: 50 ug via INTRAVENOUS
  Administered 2019-12-27 (×3): 25 ug via INTRAVENOUS

## 2019-12-27 MED ORDER — PROPOFOL 10 MG/ML IV BOLUS
INTRAVENOUS | Status: AC
  Filled 2019-12-27: qty 40

## 2019-12-27 MED ORDER — DEXAMETHASONE SODIUM PHOSPHATE 10 MG/ML IJ SOLN
INTRAMUSCULAR | Status: AC
  Filled 2019-12-27: qty 1

## 2019-12-27 MED ORDER — ONDANSETRON HCL 4 MG/2ML IJ SOLN
INTRAMUSCULAR | Status: DC | PRN
  Administered 2019-12-27: 4 mg via INTRAVENOUS

## 2019-12-27 MED ORDER — SODIUM CHLORIDE 0.9 % IR SOLN
Status: DC | PRN
  Administered 2019-12-27: 3000 mL

## 2019-12-27 MED ORDER — CEFAZOLIN SODIUM-DEXTROSE 2-4 GM/100ML-% IV SOLN
2.0000 g | INTRAVENOUS | Status: AC
  Administered 2019-12-27: 2 g via INTRAVENOUS

## 2019-12-27 MED ORDER — FENTANYL CITRATE (PF) 100 MCG/2ML IJ SOLN: 25.0000 ug | INTRAMUSCULAR | Status: DC | PRN

## 2019-12-27 MED ORDER — MIDAZOLAM HCL 5 MG/5ML IJ SOLN
INTRAMUSCULAR | Status: DC | PRN
  Administered 2019-12-27: 2 mg via INTRAVENOUS

## 2019-12-27 MED ORDER — KETOROLAC TROMETHAMINE 30 MG/ML IJ SOLN
INTRAMUSCULAR | Status: AC
  Filled 2019-12-27: qty 1

## 2019-12-27 MED ORDER — PHENAZOPYRIDINE HCL 100 MG PO TABS
200.0000 mg | ORAL_TABLET | Freq: Once | ORAL | Status: AC
  Administered 2019-12-27: 200 mg via ORAL

## 2019-12-27 MED ORDER — PHENAZOPYRIDINE HCL 100 MG PO TABS
ORAL_TABLET | ORAL | Status: AC
  Filled 2019-12-27: qty 2

## 2019-12-27 MED ORDER — DEXAMETHASONE SODIUM PHOSPHATE 10 MG/ML IJ SOLN
INTRAMUSCULAR | Status: DC | PRN
  Administered 2019-12-27: 10 mg via INTRAVENOUS

## 2019-12-27 MED ORDER — KETOROLAC TROMETHAMINE 30 MG/ML IJ SOLN: 30.0000 mg | Freq: Once | INTRAMUSCULAR | Status: DC | PRN

## 2019-12-27 MED ORDER — FENTANYL CITRATE (PF) 100 MCG/2ML IJ SOLN
INTRAMUSCULAR | Status: AC
  Filled 2019-12-27: qty 2

## 2019-12-27 MED ORDER — CEFAZOLIN SODIUM-DEXTROSE 2-4 GM/100ML-% IV SOLN
INTRAVENOUS | Status: AC
  Filled 2019-12-27: qty 100

## 2019-12-27 MED ORDER — PROPOFOL 10 MG/ML IV BOLUS
INTRAVENOUS | Status: DC | PRN
  Administered 2019-12-27: 100 mg via INTRAVENOUS
  Administered 2019-12-27: 200 mg via INTRAVENOUS

## 2019-12-27 MED ORDER — MIDAZOLAM HCL 2 MG/2ML IJ SOLN
INTRAMUSCULAR | Status: AC
  Filled 2019-12-27: qty 2

## 2019-12-27 SURGICAL SUPPLY — 22 items
BAG DRAIN URO-CYSTO SKYTR STRL (DRAIN) ×2 IMPLANT
BASKET ZERO TIP NITINOL 2.4FR (BASKET) IMPLANT
CATH INTERMIT  6FR 70CM (CATHETERS) ×2 IMPLANT
CLOTH BEACON ORANGE TIMEOUT ST (SAFETY) IMPLANT
ELECT REM PT RETURN 9FT ADLT (ELECTROSURGICAL)
ELECTRODE REM PT RTRN 9FT ADLT (ELECTROSURGICAL) IMPLANT
FIBER LASER FLEXIVA 365 (UROLOGICAL SUPPLIES) IMPLANT
FIBER LASER TRAC TIP (UROLOGICAL SUPPLIES) IMPLANT
GAUZE SPONGE 4X4 12PLY STRL LF (GAUZE/BANDAGES/DRESSINGS) ×2 IMPLANT
GLOVE BIO SURGEON STRL SZ8 (GLOVE) ×2 IMPLANT
GOWN STRL REUS W/ TWL XL LVL3 (GOWN DISPOSABLE) ×1 IMPLANT
GOWN STRL REUS W/TWL XL LVL3 (GOWN DISPOSABLE) ×2
GUIDEWIRE ANG ZIPWIRE 038X150 (WIRE) IMPLANT
GUIDEWIRE STR DUAL SENSOR (WIRE) ×2 IMPLANT
IV NS IRRIG 3000ML ARTHROMATIC (IV SOLUTION) ×2 IMPLANT
KIT TURNOVER CYSTO (KITS) ×2 IMPLANT
MANIFOLD NEPTUNE II (INSTRUMENTS) ×2 IMPLANT
NS IRRIG 500ML POUR BTL (IV SOLUTION) ×2 IMPLANT
SHEATH URETERAL 12FRX35CM (MISCELLANEOUS) IMPLANT
TRAY CYSTO PACK (CUSTOM PROCEDURE TRAY) ×2 IMPLANT
TUBE CONNECTING 12X1/4 (SUCTIONS) ×2 IMPLANT
TUBING UROLOGY SET (TUBING) IMPLANT

## 2019-12-27 NOTE — Anesthesia Procedure Notes (Signed)
Procedure Name: LMA Insertion Date/Time: 12/27/2019 8:33 AM Performed by: Jessica Priest, CRNA Pre-anesthesia Checklist: Patient identified, Emergency Drugs available, Suction available, Patient being monitored and Timeout performed Patient Re-evaluated:Patient Re-evaluated prior to induction Oxygen Delivery Method: Circle system utilized Preoxygenation: Pre-oxygenation with 100% oxygen Induction Type: IV induction Ventilation: Mask ventilation without difficulty LMA: LMA inserted LMA Size: 5.0 Number of attempts: 1 Airway Equipment and Method: Bite block Placement Confirmation: positive ETCO2,  CO2 detector and breath sounds checked- equal and bilateral Tube secured with: Tape Dental Injury: Teeth and Oropharynx as per pre-operative assessment  Comments: Teeth poor condition preop - chipped, cracked upper left incisor, discolored, spacing

## 2019-12-27 NOTE — Discharge Instructions (Signed)
1. You may see some blood in the urine and may have some burning with urination for 48-72 hours. You also may notice that you have to urinate more frequently or urgently after your procedure which is normal.  2. You should call should you develop an inability urinate, fever > 101, persistent nausea and vomiting that prevents you from eating or drinking to stay hydrated.  3. If you have a stent, you will likely urinate more frequently and urgently until the stent is removed and you may experience some discomfort/pain in the lower abdomen and flank especially when urinating. You may take pain medication prescribed to you if needed for pain. You may also intermittently have blood in the urine until the stent is removed. 4. If you have a catheter, you will be taught how to take care of the catheter by the nursing staff prior to discharge from the hospital.  You may periodically feel a strong urge to void with the catheter in place.  This is a bladder spasm and most often can occur when having a bowel movement or moving around. It is typically self-limited and usually will stop after a few minutes.  You may use some Vaseline or Neosporin around the tip of the catheter to reduce friction at the tip of the penis. You may also see some blood in the urine.  A very small amount of blood can make the urine look quite red.  As long as the catheter is draining well, there usually is not a problem.  However, if the catheter is not draining well and is bloody, you should call the office [phone]) to notify us.  Post Anesthesia Home Care Instructions  Activity: Get plenty of rest for the remainder of the day. A responsible individual must stay with you for 24 hours following the procedure.  For the next 24 hours, DO NOT: -Drive a car -Advertising copywriter -Drink alcoholic beverages -Take any medication unless instructed by your physician -Make any legal decisions or sign important papers.  Meals: Start with  liquid foods such as gelatin or soup. Progress to regular foods as tolerated. Avoid greasy, spicy, heavy foods. If nausea and/or vomiting occur, drink only clear liquids until the nausea and/or vomiting subsides. Call your physician if vomiting continues.  Special Instructions/Symptoms: Your throat may feel dry or sore from the anesthesia or the breathing tube placed in your throat during surgery. If this causes discomfort, gargle with warm salt water. The discomfort should disappear within 24 hours.    No ibuprofen, Motrin, Advil, Aleve, or naproxen until after 3:00 pm today if needed.

## 2019-12-27 NOTE — Anesthesia Preprocedure Evaluation (Addendum)
Anesthesia Evaluation  Patient identified by MRN, date of birth, ID band Patient awake    Reviewed: Allergy & Precautions, NPO status , Patient's Chart, lab work & pertinent test results  Airway Mallampati: II  TM Distance: >3 FB Neck ROM: Full    Dental no notable dental hx.    Pulmonary Current Smoker,  Chronic bronchitis   Pulmonary exam normal breath sounds clear to auscultation       Cardiovascular hypertension,  Rhythm:Regular Rate:Tachycardia     Neuro/Psych negative neurological ROS  negative psych ROS   GI/Hepatic Neg liver ROS, GERD  ,  Endo/Other  negative endocrine ROS  Renal/GU negative Renal ROS  negative genitourinary   Musculoskeletal negative musculoskeletal ROS (+)   Abdominal   Peds negative pediatric ROS (+)  Hematology negative hematology ROS (+)   Anesthesia Other Findings   Reproductive/Obstetrics negative OB ROS                            Anesthesia Physical Anesthesia Plan  ASA: III  Anesthesia Plan: General   Post-op Pain Management:    Induction: Intravenous  PONV Risk Score and Plan: 1 and Ondansetron, Dexamethasone and Treatment may vary due to age or medical condition  Airway Management Planned: LMA  Additional Equipment:   Intra-op Plan:   Post-operative Plan: Extubation in OR  Informed Consent: I have reviewed the patients History and Physical, chart, labs and discussed the procedure including the risks, benefits and alternatives for the proposed anesthesia with the patient or authorized representative who has indicated his/her understanding and acceptance.     Dental advisory given  Plan Discussed with: CRNA and Surgeon  Anesthesia Plan Comments:        Anesthesia Quick Evaluation

## 2019-12-27 NOTE — Interval H&P Note (Signed)
History and Physical Interval Note:  12/27/2019 7:36 AM  Jesus Wells  has presented today for surgery, with the diagnosis of LEFT HYDRONEPHROSIS / URETEROCELE.  The various methods of treatment have been discussed with the patient and family. After consideration of risks, benefits and other options for treatment, the patient has consented to  Procedure(s) with comments: CYSTOSCOPY WITH RETROGRADE PYELOGRAM (Left) - 45 MINS as a surgical intervention.  The patient's history has been reviewed, patient examined, no change in status, stable for surgery.  I have reviewed the patient's chart and labs.  Questions were answered to the patient's satisfaction.     Bertram Millard Courney Garrod

## 2019-12-27 NOTE — Transfer of Care (Signed)
Immediate Anesthesia Transfer of Care Note  Patient: Jesus Wells  Procedure(s) Performed: Procedure(s) (LRB): CYSTOSCOPY WITH  INCISION OF LEFT URETER (Left)  Patient Location: PACU  Anesthesia Type: General  Level of Consciousness: awake, sedated, patient cooperative and responds to stimulation  Airway & Oxygen Therapy: Patient Spontanous Breathing and Patient connected to [place] 02 and soft FM   Post-op Assessment: Report given to PACU RN, Post -op Vital signs reviewed and stable and Patient moving all extremities  Post vital signs: Reviewed and stable  Complications: No apparent anesthesia complications

## 2019-12-27 NOTE — Op Note (Signed)
Preoperative diagnosis: Left ureterocele with flank pain, evidence of obstruction  Postoperative diagnosis: Same  Principal procedure: Cystoscopy, transurethral incision of ureterocele  Surgeon: Erykah Lippert  Anesthesia: General with LMA  Complications: None  Specimen: None  Drains: None  Estimated blood loss: Less than 10 mL  Indications: 54 year old male with recent presentation of flank pain, left hydroureteronephrosis of upper pole moiety and a ureterocele.  Lasix renogram confirmed obstructive process.  The patient, with his flank pain and obstruction, presents at this time for incision of his ureterocele.  I discussed the procedure, risks and complications which include but are not limited to infection, recurrence of ureterocele secondary to stenosis, anesthetic complications, among others.  He understands and desires to proceed.  Findings: Urethra was normal.  Bladder neck on the left was somewhat full due to the ureterocele.  His left lower pole ureter was somewhat medially placed with a fairly normal configuration.  There was a solitary/single right ureteral orifice.  I did not see the orifice of the ureterocele.  Systematic inspection of the bladder urothelium revealed normal findings.  Following incision of the ureterocele, the left ureter was quite capacious.  Description of procedure: The patient was properly identified and marked in the holding area.  He received preoperative IV antibiotics.  He was given brisk IV fluids before induction to hopefully fill the upper pole unit/ureterocele.  He was taken to the operating room where general anesthetic was administered with the LMA.  He was placed in the dorsolithotomy position.  Genitalia and perineum were prepped and draped.  Proper timeout performed.  Urethral meatus dilated to 28 [place] with [employer] sounds.  21 French cystoscope was then advanced into the bladder under direct vision.  There were no urethral lesions, prostate  nonobstructive.  There was the ureterocele noted at the bladder neck on the left.  I was unable to identify the ureteral orifice.  Following general inspection of the bladder, the cystoscope was removed and using the visual obturator the 26 French resectoscope sheath was placed.  The [employer] was then placed.  The ureterocele was then incised starting in the superolateral area, and then incised down inferomedially.  The incision was approximately 1-1/2 cm.  This opened up a generous portion of the ureterocele.  It was evident that the ureter proximal to this was dilated.  There was no bleeding at this point.  The incision was well away from the lower pole ureteral orifice.  At this point, the bladder was drained.  The scope was removed and the procedure terminated.  The patient was awakened, taken to the PACU in stable condition, having tolerated the procedure well.

## 2019-12-27 NOTE — Anesthesia Postprocedure Evaluation (Signed)
Anesthesia Post Note  Patient: Jesus Wells  Procedure(s) Performed: CYSTOSCOPY WITH  INCISION OF LEFT URETER (Left Ureter)     Patient location during evaluation: PACU Anesthesia Type: General Level of consciousness: awake and alert Pain management: pain level controlled Vital Signs Assessment: post-procedure vital signs reviewed and stable Respiratory status: spontaneous breathing, nonlabored ventilation, respiratory function stable and patient connected to nasal cannula oxygen Cardiovascular status: blood pressure returned to baseline and stable Postop Assessment: no apparent nausea or vomiting Anesthetic complications: no    Last Vitals:  Vitals:   12/27/19 0710 12/27/19 0908  BP: (!) 157/90   Pulse: (!) 106 90  Resp: 18 (!) 9  Temp: 36.8 C 36.4 C  SpO2: 98% 99%    Last Pain:  Vitals:   12/27/19 0908  TempSrc:   PainSc: Asleep                 [name] S

## 2019-12-28 NOTE — Addendum Note (Signed)
Addendum  created 12/28/19 2440 by Francie Massing, CRNA   Charge Capture section accepted

## 2021-12-03 IMAGING — NM NM RENAL IMAGING FLOW W/ PHARM
3 series · 13 of 13 positions shown · non-contrast
Comparison: 10/09/2019

CLINICAL DATA: Left-sided hydronephrosis, duplicated left ureter,
dysuria, pain

EXAM:
NUCLEAR MEDICINE RENAL SCAN WITH DIURETIC ADMINISTRATION
TECHNIQUE: Radionuclide angiographic and sequential renal images were obtained
after intravenous injection of radiopharmaceutical. Imaging was
continued during slow intravenous injection of Lasix approximately
15 minutes after the start of the examination.
RADIOPHARMACEUTICALS:  5.0 mCi Wechnetium-KKm MAG3 IV

[Series 1: renal scan · 4.14mm/px · 6 of 40 frames shown (1 of 2)]
[frame 4/40  full-range]
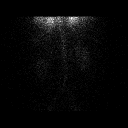
[frame 10/40  full-range]
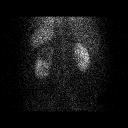
[frame 17/40  full-range]
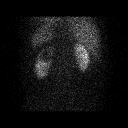
[frame 24/40  full-range]
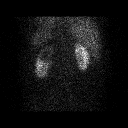
[frame 30/40  full-range]
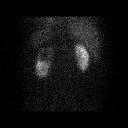
[frame 37/40  full-range]
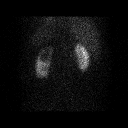

[Series 1: renal scan · 4.14mm/px · 6 of 90 frames shown (2 of 2)]
[frame 8/90]
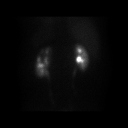
[frame 23/90]
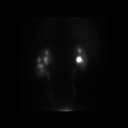
[frame 38/90]
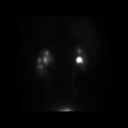
[frame 53/90]
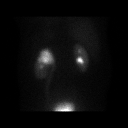
[frame 68/90]
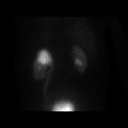
[frame 83/90]
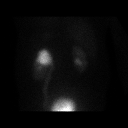

[Series 2: pre void · non-contrast · 2.07mm/px · 1 of 1 slices shown]
[im 1/1]
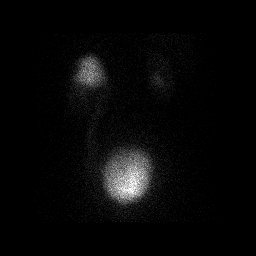

[13 of 13 positions shown; findings below may reference images not displayed]

FINDINGS: Flow:  Prompt symmetric arterial flow to the kidneys.

Left renogram: Photopenia within the upper pole left kidney
consistent with the chronic obstruction of the upper pole moiety
seen on multiple prior CTs. T-max measures 4.7 minutes. There is a
blunted peak of the left renogram, with decreased slope of the curve
during the excretory phase. Blunted response to Lasix, primarily
within the upper pole moiety and upper pole ureter.

Right renogram: Normal right renogram curve. No focal abnormalities.
T-max measures 7.2 minutes.

Differential:

Left kidney = 44 %

Right kidney = 56 %

T1/2 post Lasix :

Left kidney = cannot be calculated due to blunted response to Lasix.
87% retained activity at 10 minutes after Lasix administration.

Right kidney = 6.1 min
IMPRESSION: 1. Findings compatible with high-grade obstruction of the left
kidney upper pole moiety and duplicated upper pole ureter. No
significant response to Lasix.
2. Normal right kidney.  Normal physiologic response to Lasix.

## 2022-12-08 ENCOUNTER — Encounter (HOSPITAL_COMMUNITY): Payer: Self-pay | Admitting: *Deleted

## 2022-12-08 ENCOUNTER — Other Ambulatory Visit: Payer: Self-pay

## 2022-12-08 ENCOUNTER — Ambulatory Visit (HOSPITAL_COMMUNITY)
Admission: EM | Admit: 2022-12-08 | Discharge: 2022-12-08 | Disposition: A | Discharge status: Home / Self Care | Payer: 59 | Attending: Emergency Medicine | Admitting: Emergency Medicine

## 2022-12-08 ENCOUNTER — Ambulatory Visit (INDEPENDENT_AMBULATORY_CARE_PROVIDER_SITE_OTHER): Payer: 59

## 2022-12-08 DIAGNOSIS — M5442 Lumbago with sciatica, left side: Secondary | ICD-10-CM

## 2022-12-08 DIAGNOSIS — M5137 Other intervertebral disc degeneration, lumbosacral region: Secondary | ICD-10-CM | POA: Diagnosis not present

## 2022-12-08 MED ORDER — METHYLPREDNISOLONE SODIUM SUCC 125 MG IJ SOLR
60.0000 mg | Freq: Once | INTRAMUSCULAR | Status: AC
  Administered 2022-12-08: 60 mg via INTRAMUSCULAR

## 2022-12-08 MED ORDER — KETOROLAC TROMETHAMINE 30 MG/ML IJ SOLN
30.0000 mg | Freq: Once | INTRAMUSCULAR | Status: AC
  Administered 2022-12-08: 30 mg via INTRAMUSCULAR

## 2022-12-08 MED ORDER — METHYLPREDNISOLONE SODIUM SUCC 125 MG IJ SOLR
INTRAMUSCULAR | Status: AC
  Filled 2022-12-08: qty 2

## 2022-12-08 MED ORDER — KETOROLAC TROMETHAMINE 30 MG/ML IJ SOLN
INTRAMUSCULAR | Status: AC
  Filled 2022-12-08: qty 1

## 2022-12-08 NOTE — ED Triage Notes (Signed)
Pt reports back pain for 3 days. Pt reports he twisted his back.

## 2022-12-08 NOTE — ED Provider Notes (Signed)
[facility]    CSN: [id number] Arrival date & time: 12/08/22  1504      History   Chief Complaint Chief Complaint  Patient presents with   Back Pain    HPI Jesus Wells is a 57 y.o. male.   Patient reports 'tweaking' his back Sunday evening, he was getting up from sitting in a chair when he hurt his back.  He now has numbness and tingling going down his left leg.  He has tried Tylenol and Flexeril with minor pain relief.  He did see his primary care earlier today who gave him a steroid taper, patient has not started this.  He did request stronger pain medication from his primary care provider, who did not provided.  He does have a history of back surgery.  Does not have an orthopedic provider that he sees for his back pain and issues.  Pain increases with bending, twisting and position changes.  He denies any incontinence, loss of bowel or bladder function.  Denies fevers.  Denies recent falls.   The history is provided by the patient and medical records.  Back Pain Associated symptoms: numbness   Associated symptoms: no abdominal pain, no dysuria and no fever     Past Medical History:  Diagnosis Date   Duplicated ureter, left    with chronic obstruction   GERD (gastroesophageal reflux disease)    Headache    Hypertension    Prostatitis    Pyelonephritis 10/2019    Patient Active Problem List   Diagnosis Date Noted   Prostatitis 10/11/2019   Hematuria 10/10/2019   Dysuria 10/10/2019   Duplicated ureter, left    Pyelonephritis of left kidney 10/09/2019   Hypertension    Hyperlipidemia     Past Surgical History:  Procedure Laterality Date   BACK SURGERY  2011   lower   CYSTOSCOPY W/ RETROGRADES Left 12/27/2019   Procedure: CYSTOSCOPY WITH  INCISION OF LEFT URETER;  Surgeon: Marcine Matar, MD;  Location: [facility];  Service: Urology;  Laterality: Left;  45 MINS   HAND SURGERY Right 35 yrs ago       Home Medications     Prior to Admission medications   Medication Sig Start Date End Date Taking? Authorizing Provider  amitriptyline (ELAVIL) 100 MG tablet Take 100 mg by mouth every evening.     [provider]  atorvastatin (LIPITOR) 20 MG tablet Take 20 mg by mouth daily at 6 PM.  08/14/19   [provider]  calcium carbonate (TUMS - DOSED IN MG ELEMENTAL CALCIUM) 500 MG chewable tablet Chew 1 tablet by mouth as needed for indigestion or heartburn.    [provider]  cyclobenzaprine (FLEXERIL) 5 MG tablet Take 5 mg by mouth 3 (three) times daily as needed for muscle spasms.    [provider]  hydrochlorothiazide (HYDRODIURIL) 12.5 MG tablet Take 12.5 mg by mouth every evening.  10/02/19   [provider]  propranolol ER (INDERAL LA) 60 MG 24 hr capsule Take 60 mg by mouth every evening.  09/10/19   [provider]    Family History Family History  Problem Relation Age of Onset   Healthy Mother    Healthy Father     Social History Social History   Tobacco Use   Smoking status: Every Day    Packs/day: 1.50    Years: 30.00    Additional pack years: 0.00    Total pack years: 45.00  Types: Cigarettes   Smokeless tobacco: Former  Building services engineer Use: Never used  Substance Use Topics   Alcohol use: No   Drug use: Yes    Types: Marijuana    Comment: marijuana last used 12-20-2019     Allergies   Onion   Review of Systems Review of Systems  Constitutional:  Negative for chills, fatigue and fever.  Gastrointestinal:  Negative for abdominal pain.  Genitourinary:  Negative for dysuria.  Musculoskeletal:  Positive for back pain and gait problem.  Neurological:  Positive for numbness.     Physical Exam Triage Vital Signs ED Triage Vitals  Enc Vitals Group     BP 12/08/22 1641 (!) 149/101     Pulse Rate 12/08/22 1641 (!) 113     Resp 12/08/22 1641 20     Temp 12/08/22 1641 98.3 F (36.8 C)     Temp src --      SpO2 12/08/22 1641  93 %     Weight --      Height --      Head Circumference --      Peak Flow --      Pain Score 12/08/22 1639 8     Pain Loc --      Pain Edu? --      Excl. in GC? --    No data found.  Updated Vital Signs BP (!) 149/101   Pulse (!) 113   Temp 98.3 F (36.8 C)   Resp 20   SpO2 93%   Visual Acuity Right Eye Distance:   Left Eye Distance:   Bilateral Distance:    Right Eye Near:   Left Eye Near:    Bilateral Near:     Physical Exam Vitals and nursing note reviewed.  Constitutional:      Appearance: Normal appearance.  HENT:     Head: Normocephalic and atraumatic.     Right Ear: External ear normal.     Left Ear: External ear normal.     Nose: Nose normal.     Mouth/Throat:     Mouth: Mucous membranes are moist.  Eyes:     General: No scleral icterus.    Conjunctiva/sclera: Conjunctivae normal.  Cardiovascular:     Rate and Rhythm: Normal rate.  Pulmonary:     Effort: Pulmonary effort is normal. No respiratory distress.  Musculoskeletal:        General: Tenderness present. No swelling, deformity or signs of injury. Normal range of motion.  Skin:    General: Skin is warm and dry.  Neurological:     General: No focal deficit present.     Mental Status: He is alert and oriented to person, place, and time.  Psychiatric:        Mood and Affect: Mood normal.        Behavior: Behavior normal. Behavior is cooperative.      UC Treatments / Results  Labs (all labs ordered are listed, but only abnormal results are displayed) Labs Reviewed - No data to display  EKG   Radiology DG Lumbar Spine Complete  Result Date: 12/08/2022 CLINICAL DATA:  Back pain. EXAM: LUMBAR SPINE - COMPLETE 4+ VIEW COMPARISON:  None Available. FINDINGS: Mild loss of disc height noted L4-5 and L5-S1. No evidence for fracture. No subluxation. Facets are well aligned bilaterally. SI joints unremarkable. IMPRESSION: Mild degenerative disc disease at L4-5 and L5-S1. Electronically Signed    By: Kennith Center M.D.   On:  12/08/2022 18:07    Procedures Procedures (including critical care time)  Medications Ordered in UC Medications  ketorolac (TORADOL) 30 MG/ML injection 30 mg (30 mg Intramuscular Given 12/08/22 1719)  methylPREDNISolone sodium succinate (SOLU-MEDROL) 125 mg/2 mL injection 60 mg (60 mg Intramuscular Given 12/08/22 1719)    Initial Impression / Assessment and Plan / UC Course  I have reviewed the triage vital signs and the nursing notes.  Pertinent labs & imaging results that were available during my care of the patient were reviewed by me and considered in my medical decision making (see chart for details).  Vitals and triage reviewed, patient is hemodynamically stable.  Left-sided lumbar pain and tenderness to palpation, lumbar spine tenderness to palpation.  Imaging obtained in clinic of lumbar spine, imaging with mild degenerative disc disease at L4-5 and L5-S1.   Discussed that while narcotics are not indicated, we can trial an IM steroid injection and IM Toradol.  Patient agreeable to plan.  Return and emergency precautions reviewed, no questions at this time.     Final Clinical Impressions(s) / UC Diagnoses   Final diagnoses:  Acute left-sided low back pain with left-sided sciatica  Degeneration of lumbar or lumbosacral intervertebral disc     Discharge Instructions      Your x-rays were negative for any fracture or dislocation, they do show mild degenerative disc disease at L4-5 and L5-S1.  We have given you an injection of Toradol and a steroid in clinic today.  This should ease off your pain.  You can take the steroid taper starting tomorrow with breakfast as well.  I suggest following up with [facility] for further evaluation and potential alternate treatment plans.  Please seek immediate care if you are unable to walk, develop incontinence, or any new concerning symptoms.      ED Prescriptions   None    I have reviewed  the PDMP during this encounter.   Donye Dauenhauer, [place], [place] 12/08/22 [phone]

## 2022-12-08 NOTE — Discharge Instructions (Addendum)
Your x-rays were negative for any fracture or dislocation, they do show mild degenerative disc disease at L4-5 and L5-S1.  We have given you an injection of Toradol and a steroid in clinic today.  This should ease off your pain.  You can take the steroid taper starting tomorrow with breakfast as well.  I suggest following up with [facility] for further evaluation and potential alternate treatment plans.  Please seek immediate care if you are unable to walk, develop incontinence, or any new concerning symptoms.
